# Patient Record
Sex: Female | Born: 1965 | Hispanic: Yes | Marital: Single | Smoking: Never smoker
Health system: Southern US, Community
[De-identification: ages and names within clinical notes are randomized; demographics above are authoritative.]

## PROBLEM LIST (undated history)

## (undated) DIAGNOSIS — K529 Noninfective gastroenteritis and colitis, unspecified: Secondary | ICD-10-CM

## (undated) DIAGNOSIS — R160 Hepatomegaly, not elsewhere classified: Secondary | ICD-10-CM

## (undated) DIAGNOSIS — K219 Gastro-esophageal reflux disease without esophagitis: Secondary | ICD-10-CM

## (undated) DIAGNOSIS — E785 Hyperlipidemia, unspecified: Secondary | ICD-10-CM

## (undated) DIAGNOSIS — R52 Pain, unspecified: Secondary | ICD-10-CM

## (undated) HISTORY — DX: Hyperlipidemia, unspecified: E78.5

## (undated) HISTORY — DX: Hepatomegaly, not elsewhere classified: R16.0

## (undated) HISTORY — PX: TUBAL LIGATION: SHX77

---

## 2007-02-12 HISTORY — PX: NECK MASS EXCISION: SHX2079

## 2012-07-21 ENCOUNTER — Ambulatory Visit: Payer: No Typology Code available for payment source | Attending: Family Medicine | Admitting: Family Medicine

## 2012-07-21 ENCOUNTER — Encounter: Payer: Self-pay | Admitting: Family Medicine

## 2012-07-21 VITALS — BP 126/79 | HR 63 | Temp 98.0°F | Resp 18 | Ht <= 58 in | Wt 153.0 lb

## 2012-07-21 DIAGNOSIS — R1011 Right upper quadrant pain: Secondary | ICD-10-CM | POA: Insufficient documentation

## 2012-07-21 DIAGNOSIS — R5381 Other malaise: Secondary | ICD-10-CM

## 2012-07-21 DIAGNOSIS — D1803 Hemangioma of intra-abdominal structures: Secondary | ICD-10-CM

## 2012-07-21 DIAGNOSIS — R5383 Other fatigue: Secondary | ICD-10-CM | POA: Insufficient documentation

## 2012-07-21 LAB — POCT URINALYSIS DIPSTICK
Blood, UA: NEGATIVE
Glucose, UA: NEGATIVE
Nitrite, UA: NEGATIVE
Protein, UA: NEGATIVE
Spec Grav, UA: 1.02
Urobilinogen, UA: 0.2

## 2012-07-21 LAB — POCT GLYCOSYLATED HEMOGLOBIN (HGB A1C): Hemoglobin A1C: 5.6

## 2012-07-21 LAB — CBC
Platelets: 256 10*3/uL (ref 150–400)
RBC: 5.48 MIL/uL — ABNORMAL HIGH (ref 3.87–5.11)
RDW: 14.1 % (ref 11.5–15.5)
WBC: 6.3 10*3/uL (ref 4.0–10.5)

## 2012-07-21 NOTE — Patient Instructions (Addendum)
Dolor abdominal, versión ampliada °(Abdominal Pain, Nonspecific) °El análisis podría no mostrar la razón exacta por la que tiene dolor abdominal. Debido a que hay muchas causas distintas de dolor abdominal, se podrá necesitar otro control y más análisis. Es muy importante el seguimiento para observar los síntomas duraderos (persistentes) o los que empeoran. Una causa posible de dolor abdominal en cualquier persona que aún tiene su apéndice es la apendicitis aguda. La apendicitis es a menudo difícil de diagnosticar. Los análisis de sangre, orina, ultrasonido y tomografía computada no pueden descartar por completo la apendicitis u otra causas de dolor abdominal. A veces, sólo los cambios que se producen a través del tiempo permitirán determinar si el dolor abdominal se debe al apendicitis o a otras causas. Otros problemas potenciales que pueden requerir cirugía también pueden tomar algún tiempo hasta ser evidentes. Debido a esto, es importante seguir todas las instrucciones de más abajo. °INSTRUCCIONES PARA EL CUIDADO DOMICILIARIO °· Descanse todo lo que pueda. °· No ingiera alimentos sólidos hasta que el dolor desaparezca. °· Cuando un adulto o un niño siente dolor: Puede beneficiarlo una dieta basada en agua, té liviano descafeinado, caldo o consomé, gelatina, solución de rehidratación oral, helados de agua o trocitos de hielo. °· Cuando el adulto o el niño no sienten más dolor: Consuma una dieta liviana (tostadas secas, crackers, jugo de manzana o arroz blanco). Incorpore más alimentos lentamente, siempre que esto no le cause ningún trastorno. No consuma productos lácteos (incluyendo queso y huevos) ni ingiera alimentos condimentados, grasos, fritos o con gran cantidad de fibra. °· No consuma alcohol, cafeína ni cigarrillos. °· Tome sus medicamentos regularmente, excepto que el profesional le indique lo contrario. °· Utilice los medicamentos de venta libre o de prescripción para el dolor, el malestar o la fiebre,  según se lo indique el profesional que lo asiste. °· Utilice los medicamentos de venta libre o de prescripción para el dolor, el malestar o la fiebre, según se lo indique el profesional que lo asiste. No administre aspirina a los niños. °Si el médico le ha dado fecha para una visita de control, es importante que concurra. No cumplir con este control puede dar como resultado que el daño, el dolor o la discapacidad sean permanentes (crónicos). Si tiene problemas para asistir al control, deberá comunicarlo en este establecimiento para recibir asesoramiento.  °SOLICITE ATENCIÓN MÉDICA DE INMEDIATO SI: °· Usted o su niño han sufrido dolor por más de 24 horas. °· El dolor empeora, cambia de lugar o se siente diferente. °· Usted o su niño tienen una temperatura oral de más de 38,9° C (102° F) y no puede ser controlada con medicamentos. °· Su bebé tiene más de 3 meses y su temperatura rectal es de 102° F (38.9° C) o más. °· Su bebé tiene 3 meses o menos y su temperatura rectal es de 100.4° F (38° C) o más. °· Usted o su hijo tienen escalofríos. °· Continúan con vómitos y no pueden retener líquidos. °· Observa sangre en el vómito o en la materia fecal. °· Las heces son oscuras o negras. °· Los movimientos intestinales son frecuentes. °· Los movimientos intestinales se detienen (hay una obstrucción) o no pueden eliminarse los gases. °· Siente dolor al orinar o lo hace con frecuencia u observa sangre en la orina. °· La piel y la zona blanca de los ojos cambian de color y se tornan amarillos. °· Observa que el estómago se hincha o está más grande. °· Sienten mareos o desmayos. °· Sienten dolor en   el pecho o la espalda. °ESTÉ SEGURO QUE:  °· Comprende las instrucciones para el alta médica. °· Controlará su enfermedad. °· Solicitará atención médica de inmediato según las indicaciones. °Document Released: 05/07/2007 Document Revised: 04/22/2011 °ExitCare® Patient Information ©2014 [employer]. ° °

## 2012-07-21 NOTE — Progress Notes (Signed)
Patient states she is here to establish care; she states she was diagnosed with a mass on her liver 1 year ago.

## 2012-07-21 NOTE — Progress Notes (Signed)
Patient ID: Deborah Trujillo, female   DOB: Jun 22, 1965, 47 y.o.   MRN: [id number]  CC: Get established  HPI: Translator use to communicate with patient.  This patient is presenting today as a new patient to the clinic to evaluate a hemangioma that she was diagnosed as having on her liver approximately one year ago in [place].  She had an MRI at that time.  She reports that she was told that she possibly needed surgery to have the tumor removed but it was a risky procedure.  She never followed up.  She has not been seen in over one year.  She has not had labs done in over one year.  She is presenting today reporting that she continues to have occasional pain and discomfort in the right upper quadrant.  No nausea or vomiting.  No weight loss.  No fever chills reported.  No Known Allergies Past Medical History  Diagnosis Date  . Liver mass    No current outpatient prescriptions on file prior to visit.   No current facility-administered medications on file prior to visit.   Family History  Problem Relation Age of Onset  . Diabetes Mother    History   Social History  . Marital Status: Single    Spouse Name: N/A    Number of Children: N/A  . Years of Education: N/A   Occupational History  . Not on file.   Social History Main Topics  . Smoking status: Never Smoker   . Smokeless tobacco: Not on file  . Alcohol Use: No  . Drug Use: No  . Sexually Active: Not on file   Other Topics Concern  . Not on file   Social History Narrative  . No narrative on file    Review of Systems  Constitutional: Negative for fever, chills, diaphoresis, activity change, appetite change and fatigue.  HENT: Negative for ear pain, nosebleeds, congestion, facial swelling, rhinorrhea, neck pain, neck stiffness and ear discharge.   Eyes: Negative for pain, discharge, redness, itching and visual disturbance.  Respiratory: Negative for cough, choking, chest tightness, shortness of breath, wheezing and  stridor.   Cardiovascular: Negative for chest pain, palpitations and leg swelling.  Gastrointestinal: abdominal discomfort RUQ. Genitourinary: Negative for dysuria, urgency, frequency, hematuria, flank pain, decreased urine volume, difficulty urinating and dyspareunia.  Musculoskeletal: Negative for back pain, joint swelling, arthralgias and gait problem.  Neurological: Negative for dizziness, tremors, seizures, syncope, facial asymmetry, speech difficulty, weakness, light-headedness, numbness and headaches.  Hematological: Negative for adenopathy. Does not bruise/bleed easily.  Psychiatric/Behavioral: Negative for hallucinations, behavioral problems, confusion, dysphoric mood, decreased concentration and agitation.    Objective:   Filed Vitals:   07/21/12 1318  BP: 126/79  Pulse: 63  Temp: 98 F (36.7 C)  Resp: 18    Physical Exam  Constitutional: Appears well-developed and well-nourished. No distress.  HENT: Normocephalic. External right and left ear normal. Oropharynx is clear and moist.  Eyes: Conjunctivae and EOM are normal. PERRLA, no scleral icterus.  Neck: Normal ROM. Neck supple. No JVD. No tracheal deviation. No thyromegaly.  CVS: RRR, S1/S2 +, no murmurs, no gallops, no carotid bruit.  Pulmonary: Effort and breath sounds normal, no stridor, rhonchi, wheezes, rales.  Abdominal: Soft. BS +,  no distension, mild hepatomegaly, mild tenderness, no rebound or guarding.  Musculoskeletal: Normal range of motion. No edema and no tenderness.  Lymphadenopathy: No lymphadenopathy noted, cervical, inguinal. Neuro: Alert. Normal reflexes, muscle tone coordination. No cranial nerve deficit. Skin: Skin is warm and  dry. No rash noted. Not diaphoretic. No erythema. No pallor.  Psychiatric: Normal mood and affect. Behavior, judgment, thought content normal.   No results found for this basename: WBC, HGB, HCT, MCV, PLT   No results found for this basename: CREATININE, BUN, NA, K, CL, CO2     No results found for this basename: HGBA1C   Lipid Panel  No results found for this basename: chol, trig, hdl, cholhdl, vldl, ldlcalc       Assessment and plan:   Hepatic Hemangioma Fatigue Question of prediabetes  Check labs today to evaluate liver enzymes Spent a long time trying to determine the translation of the forms that she brought detailing the MRI that she had done in [place] confirming that she had a hemangioma in her liver.  I discussed that with the patient today.  I want to send her to gastroenterology and [facility] for further evaluation and treatment.  The patient likely will need some intervention if her symptoms worsen.  I told her to go to the hospital if she develops severe pain in her abdomen or any other acute findings that she verbalized understanding.  This was all done in conjunction with the translator.  Followup in 4 months  The patient was given clear instructions to go to ER or return to medical center if symptoms don't improve, worsen or new problems develop.  The patient verbalized understanding.  The patient was told to call to get lab results if they haven't heard anything in the next week.    Rodney Langton, MD, CDE, FAAFP [facility] [place], [place]

## 2012-07-22 ENCOUNTER — Other Ambulatory Visit: Payer: Self-pay | Admitting: Gastroenterology

## 2012-07-22 DIAGNOSIS — R1011 Right upper quadrant pain: Secondary | ICD-10-CM

## 2012-07-22 LAB — LIPID PANEL
Cholesterol: 219 mg/dL — ABNORMAL HIGH (ref 0–200)
Total CHOL/HDL Ratio: 4.8 Ratio

## 2012-07-22 LAB — COMPREHENSIVE METABOLIC PANEL
ALT: 18 U/L (ref 0–35)
AST: 16 U/L (ref 0–37)
CO2: 27 mEq/L (ref 19–32)
Calcium: 9.5 mg/dL (ref 8.4–10.5)
Chloride: 105 mEq/L (ref 96–112)
Creat: 0.88 mg/dL (ref 0.50–1.10)
Sodium: 143 mEq/L (ref 135–145)
Total Protein: 7.3 g/dL (ref 6.0–8.3)

## 2012-07-22 LAB — TSH: TSH: 1.557 u[IU]/mL (ref 0.350–4.500)

## 2012-07-22 LAB — VITAMIN D 25 HYDROXY (VIT D DEFICIENCY, FRACTURES): Vit D, 25-Hydroxy: 28 ng/mL — ABNORMAL LOW (ref 30–89)

## 2012-07-22 NOTE — Progress Notes (Signed)
Quick Note:  Please inform patient that her labs came back okay. Her liver enzymes were within normal limits. Her cholesterol levels were elevated. I recommend that she start a low-fat low-cholesterol diet and start walking 3-5 times per week. We should recheck her cholesterol levels again in about 3-4 months.  Rodney Langton, MD, CDE, FAAFP [facility] [place], [place]   ______

## 2012-07-23 ENCOUNTER — Telehealth: Payer: Self-pay | Admitting: *Deleted

## 2012-07-23 NOTE — Telephone Encounter (Signed)
07/23/12 Patient made aware of lab results spoke with sister in law pt. Does not speak English  Hepatic Function Panel     Component Value Date/Time   PROT 7.3 07/21/2012 1357   ALBUMIN 4.0 07/21/2012 1357   AST 16 07/21/2012 1357   ALT 18 07/21/2012 1357   ALKPHOS 40 07/21/2012 1357   BILITOT 0.4 07/21/2012 1357   Sister in law made aware liver enzymes were within normal limits and her cholesrterol was elevated  Patient instructed to eat low fat low cholesterol foods and start walking 3-5 times per week. Will recheck again in 3-4 months. Verbalize and understands. Will call and schedule appointment. P.Naomee Nowland,RN BSN [facility]

## 2012-07-30 ENCOUNTER — Ambulatory Visit
Admission: RE | Admit: 2012-07-30 | Discharge: 2012-07-30 | Disposition: A | Discharge status: Home / Self Care | Payer: No Typology Code available for payment source | Source: Ambulatory Visit | Attending: Gastroenterology | Admitting: Gastroenterology

## 2012-07-30 DIAGNOSIS — R1011 Right upper quadrant pain: Secondary | ICD-10-CM

## 2012-07-30 MED ORDER — GADOXETATE DISODIUM 0.25 MMOL/ML IV SOLN
6.0000 mL | Freq: Once | INTRAVENOUS | Status: AC | PRN
  Administered 2012-07-30: 6 mL via INTRAVENOUS

## 2012-08-05 ENCOUNTER — Encounter: Payer: Self-pay | Admitting: *Deleted

## 2012-08-20 ENCOUNTER — Telehealth (INDEPENDENT_AMBULATORY_CARE_PROVIDER_SITE_OTHER): Payer: Self-pay

## 2012-08-20 NOTE — Telephone Encounter (Signed)
LM with pt's sister-in-law to request that the patient bring CD of past imaging to her appointment with Dr. Donell Beers.

## 2012-08-21 ENCOUNTER — Encounter (INDEPENDENT_AMBULATORY_CARE_PROVIDER_SITE_OTHER): Payer: Self-pay | Admitting: General Surgery

## 2012-08-21 ENCOUNTER — Ambulatory Visit (INDEPENDENT_AMBULATORY_CARE_PROVIDER_SITE_OTHER): Payer: PRIVATE HEALTH INSURANCE | Admitting: General Surgery

## 2012-08-21 VITALS — BP 130/74 | HR 66 | Temp 97.6°F | Resp 14 | Ht 60.0 in | Wt 153.4 lb

## 2012-08-21 DIAGNOSIS — D1803 Hemangioma of intra-abdominal structures: Secondary | ICD-10-CM

## 2012-08-21 NOTE — Progress Notes (Signed)
Chief Complaint  Patient presents with  . New Evaluation    eval abd pain/liver mass    HISTORY: Patient is a 14 show female who presents with a hepatic hemangioma. She is referred from Dr. Dulce Sellar.  She was diagnosed with this around a year ago after having an ultrasound and an MRI in [place].  When she was first seen in [place], her symptoms included some bloating and some epigastric burning. She was placed on some medication and change her diet and this helps some of the discomfort. She continues to have severe burning along her right flank and lateral abdomen going down her side. She also describes a feeling of warmth in this area after she has increased activity.  She denies nausea and vomiting. She denies changes in her bowel habits. She denies fevers, chills, jaundice. She denies any change in her symptoms after eating. She denies reflux and heartburn at this time. She is currently not taking any medications. 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  She does not do any heavy lifting. Her job is mainly sedentary.  She states that she is never without pain. The pain does wax and wane. It is usually around  A 2 or 3 but goes to an 8 or 9 with any significant activity and at the end of the day if she's been doing a lot of housework.  Past Medical History  Diagnosis Date  . Liver mass   . Hyperlipidemia     Past Surgical History  Procedure Laterality Date  . Neck mass excision  2009    absess removed   . Abdominal hysterectomy  1987    No current outpatient prescriptions on file.   No current facility-administered medications for this visit.     No Known Allergies   Family History  Problem Relation Age of Onset  . Diabetes Mother      History   Social History  . Marital Status: Single    Spouse Name: N/A    Number of Children: N/A  . Years of Education: N/A   Social History Main Topics  . Smoking status: Never Smoker   . Smokeless tobacco: None   . Alcohol Use: No  . Drug Use: No  . Sexually Active: None   Other Topics Concern  . None   Social History Narrative  . None     REVIEW OF SYSTEMS - PERTINENT POSITIVES ONLY: 12 point review of systems negative other than HPI and PMH except for easy bruising.  EXAM: Filed Vitals:   08/21/12 0902  BP: 130/74  Pulse: 66  Temp: 97.6 F (36.4 C)  Resp: 14   Body mass index is 29.96 kg/(m^2). Filed Weights   08/21/12 0902  Weight: 153 lb 6.4 oz (69.582 kg)     Gen:  No acute distress.  Well nourished and well groomed.   Neurological: Alert and oriented to person, place, and time. Coordination normal.  Head: Normocephalic and atraumatic.  Eyes: Conjunctivae are normal. Pupils are equal, round, and reactive to light. No scleral icterus.  Neck: Normal range of motion. Neck supple. No tracheal deviation or thyromegaly present.  Cardiovascular: Normal rate, regular rhythm, normal heart sounds and intact distal pulses.  Exam reveals no gallop and no friction rub.  No murmur heard. Respiratory: Effort normal.  No respiratory distress. No chest wall tenderness. Breath sounds normal.  No wheezes, rales or rhonchi.  GI: Soft. Bowel sounds are normal. The abdomen is soft  and nontender.  There is no rebound and no guarding.  Musculoskeletal: Normal range of motion. Extremities are nontender.  Lymphadenopathy: No cervical, preauricular, postauricular or axillary adenopathy is present Skin: Skin is warm and dry. No rash noted. No diaphoresis. No erythema. No pallor. No clubbing, cyanosis, or edema.   Psychiatric: Normal mood and affect. Behavior is normal. Judgment and thought content normal.    LABORATORY RESULTS: Available labs are reviewed  CMET normal, HCT 47, elevated lipids.    RADIOLOGY RESULTS: See E-Chart or I-Site for most recent results.  Images and reports are reviewed.  MRI IMPRESSION:  1. Benign hepatic hemangiomas, largest measuring 6.4 cm in the  posterior  segment of the right hepatic lobe.  2. Two tiny less than 5 mm cystic lesions of the pancreas body and  tail, which are almost certainly benign. Followup by MRI recommended  in 12 months to confirm stability. This recommendation follows ACR  consensus guidelines: Managing Incidental Findings on Abdominal CT:  White Paper of the ACR Incidental Findings Committee. J Am Coll  Radiol 2010;7:754-773 ADDENDUM:  Prior outside CT from [facility] dated 06/05/2011 has been  submitted for comparison. The large hemangioma in the right hepatic  lobe shows no significant change compared to this prior study. The  other smaller hemangiomas were not well visualized on previous  study, likely due to decreased contrast resolution of CT compared to  MRI. The tiny cystic lesions of the pancreas are not well visualized  on previous CT, also likely due to decreased contrast resolution of  CT compared to MRI. As stated below, followup by MRI is recommended  in 12 months to confirm stability of these tiny pancreatic lesions.   ASSESSMENT AND PLAN: Hepatic hemangioma Pt to think over surgery and risks.   She will discuss with her family.  She will call when she is ready to schedule or if she needs another appointment to discuss surgery before making up her mind.    I reviewed surgery with diagrams of anatomy.  I also reviewed her MRI images with her and her family.    The patient is considering partial hepatectomy.  The procedure was described to the patient including the incision, surgical technique, goals of surgery, post op period and recovery time.  We discussed lifting and driving restrictions as well as time off work.  The complications were described to the patient.  The complications below were discussed, as well as the fact that unpredicted complications could occur.     Bleeding, possibly requiring a blood transfusion. Infection and possible wound complications such as hernia Possible  abortion of the operation due to cirrhosis. Damage to adjacent structures Leak of bile from the surface of the liver Possible need for other procedures, such as abscess drains in radiology or endoscopy.   Possible prolonged hospital stay Possible need for ICU stay including time on the ventilator. Decreased energy level.    Possible early recurrence of cancer Possible complications of your medical problems such as heart disease or arrhythmias. Death (less than 2%)  45 min spent in evaluation, examination, and counseling, with >50% in counseling.      She was advised to consider if the level of pain she is experiencing is enough to warrant risks of procedure and time off work.     Maudry Diego MD Surgical Oncology, General and Endocrine Surgery [facility]      Visit Diagnoses: 1. Hepatic hemangioma     Primary Care Physician:  Standley Dakins, MD

## 2012-08-21 NOTE — Patient Instructions (Signed)
Whatever the operative findings, I will discuss the case with your family after we are done in the operating room.  I will talk to you in the next few days when you are more awake.  I will see you in the hospital every weekday that I am not out of town.  My partners help see patients on the weekends and if I am out of town.    WHAT HAPPENS AFTER SURGERY: After surgery, you will go first to the recovery room, then to the ICU for careful monitoring.  It is important that I am able to know your vital signs and urine output to see how you are doing.  Depending on many factors, you may be in the ICU overnight, or for several days.  You will have many tubes and lines in place which is standard for this operation.  You will have several IVs, including possibly a central line in your chest or neck.  You will likely have an arterial line in your wrist.  You will have a tube in your nose for 4-5 days in order to suction out the stomach and liver secretions to help the surgical connections heal.  YOU WILL NOT BE ABLE TO EAT FOR AT LEAST 1-3 DAYS AFTER SURGERY.  You will have a catheter in your bladder.  On your abdomen, you will have several surgical drains and possibly a pain pump with numbing medicine.  You will have compression stockings on your legs to decrease the risk of blood clots.    Sometimes anesthesia makes a decision that you may need to be left on the breathing machine for a period after surgery.  This may be based on your overall health status, or events during surgery.    We will address your pain in several ways.  We will use an IV pain pump called a "PCA," or Patient Controlled Analgesia.  This allows you to press a button and immediately receive a dose of pain medication without waiting for a nurse.  We also use IV Tylenol and sometimes IV Toradol which is similar to ibuprofen.  You may also have a pump with numbing medicine delivered directly to your incision.  I use doses and medications that work  for the majority of people, but you may need an adjustment to the dose or type of medicine if your pain is not adequately controlled.  Your throat may be sore, in which case you may need a throat spray or lozenges.    We will ask you to get out of bed the day after surgery in order to maximize your chances of not having complications.  Your risk of pneumonia and blood clots is lower with walking and sitting in the chair.  We will also ask you to perform breathing exercises.  We will also ask to you walk in your room and in the halls for the above reasons, but also in order for you to keep up your strength.    EATING: We will usually start you on clear liquids in around 1-2 days if your bowel function seems to have returned.  We advance your diet slowly to make sure you are tolerating each step.  All patients do not have a normal appetite when they go home and usually have to take 2-4 cans of nutritional supplement per day while this is improving.  Most patients also find that their taste buds do not seem the same right after surgery, and this can continue into the time  of possible post operative chemotherapy and radiation.  Some patients develop diabetes and will need assistance from a primary care doctor for medication.     GOING HOME! Usually you are able to go home in 5-7 days, depending on whether or not complications happen and what is going on with your overall health status.   If you have more health problems or if you have limited help at home, the therapists and nurses may recommend a temporary rehab or nursing facility to help you get back on your feet before you go home.  These decisions would be made while you are in the hospital with the assistance of a social worker or case manager.    Please bring all insurance/disability forms to our office for the staff to fill out   POSSIBLE COMPLICATIONS This is a very extensive operation and includes complications listed  below: Bleeding Infection and possible wound complications such as hernia Damage to adjacent structures Leak of bile from the surface of the liver Possible need for other procedures, such as abscess drains in radiology or endoscopy.   Possible prolonged hospital stay MOST PATIENTS' ENERGY LEVEL IS NOT BACK TO NORMAL FOR AT LEAST 4-6 MONTHS.  OLDER PATIENTS MAY FEEL WEAK FOR LONGER PERIODS OF TIME.   Difficulty with eating or post operative nausea (around 30%) Possible early recurrence of cancer Possible complications of your medical problems such as heart disease or arrhythmias. Death (less than 2%)  All possible complications are not listed, just the most common.    FURTHER INFORMATION? Please ask questions if you find something that we did not discuss in the office and would like more information.  If you would like another appointment if you have many questions or if your family members would like to come as well, please contact the office.     IF YOU ARE TAKING ASPIRIN, PLAVIX, COUMADIN, OR OTHER BLOOD THINNERS, LET us KNOW IMMEDIATELY SO WE CAN CONTACT YOUR PRESCRIBING HEALTH CARE PROVIDER TO HOLD THE MEDICATION FOR 5-7 DAYS BEFORE SURGERY

## 2012-08-21 NOTE — Assessment & Plan Note (Signed)
Pt to think over surgery and risks.   She will discuss with her family.  She will call when she is ready to schedule or if she needs another appointment to discuss surgery before making up her mind.    I reviewed surgery with diagrams of anatomy.  I also reviewed her MRI images with her and her family.    The patient is considering partial hepatectomy.  The procedure was described to the patient including the incision, surgical technique, goals of surgery, post op period and recovery time.  We discussed lifting and driving restrictions as well as time off work.  The complications were described to the patient.  The complications below were discussed, as well as the fact that unpredicted complications could occur.     Bleeding, possibly requiring a blood transfusion. Infection and possible wound complications such as hernia Possible abortion of the operation due to cirrhosis. Damage to adjacent structures Leak of bile from the surface of the liver Possible need for other procedures, such as abscess drains in radiology or endoscopy.   Possible prolonged hospital stay Possible need for ICU stay including time on the ventilator. Decreased energy level.    Possible early recurrence of cancer Possible complications of your medical problems such as heart disease or arrhythmias. Death (less than 2%)  45 min spent in evaluation, examination, and counseling, with >50% in counseling.

## 2013-07-09 ENCOUNTER — Telehealth (INDEPENDENT_AMBULATORY_CARE_PROVIDER_SITE_OTHER): Payer: Self-pay

## 2013-07-09 NOTE — Telephone Encounter (Signed)
LMOV pt's appt moved to 07/26/13 at 10:30 a.m.

## 2013-07-19 ENCOUNTER — Ambulatory Visit: Payer: Self-pay

## 2013-07-26 ENCOUNTER — Encounter (INDEPENDENT_AMBULATORY_CARE_PROVIDER_SITE_OTHER): Payer: Self-pay | Admitting: General Surgery

## 2013-07-26 ENCOUNTER — Ambulatory Visit (INDEPENDENT_AMBULATORY_CARE_PROVIDER_SITE_OTHER): Payer: PRIVATE HEALTH INSURANCE | Admitting: General Surgery

## 2013-07-26 VITALS — BP 118/80 | HR 72 | Temp 98.7°F | Resp 16 | Ht <= 58 in | Wt 156.6 lb

## 2013-07-26 DIAGNOSIS — K862 Cyst of pancreas: Secondary | ICD-10-CM | POA: Insufficient documentation

## 2013-07-26 DIAGNOSIS — D1803 Hemangioma of intra-abdominal structures: Secondary | ICD-10-CM

## 2013-07-26 DIAGNOSIS — K863 Pseudocyst of pancreas: Secondary | ICD-10-CM

## 2013-07-26 NOTE — Assessment & Plan Note (Signed)
Follow up MRI/MRCP to evaluate.

## 2013-07-26 NOTE — Assessment & Plan Note (Signed)
Pt does have pain exactly at location of hemangioma without musculoskeletal component.    She has not had imaging in a year.  Will repeat MRI.    Follow up after MRI.

## 2013-07-26 NOTE — Patient Instructions (Signed)
Get MRI, follow up in around 2 weeks (after scan)

## 2013-07-26 NOTE — Progress Notes (Signed)
HISTORY: Pt is here for increasing pain in R flank/RUQ near hemangioma and would like to pursue resection.  She describes pain as 6-7/10.  She is not taking pain medication.  She denies heart failure symptoms or n/v.  She denies change in pain with activity other than reaching over her head.  She also has increased pain when she sleeps on that side.  She is not having fever/ chills.     PERTINENT REVIEW OF SYSTEMS: O/w negative x 11.    Filed Vitals:   07/26/13 1051  BP: 118/80  Pulse: 72  Temp: 98.7 F (37.1 C)  Resp: 16   Wt Readings from Last 3 Encounters:  07/26/13 156 lb 9.6 oz (71.033 kg)  08/21/12 153 lb 6.4 oz (69.582 kg)  07/21/12 153 lb (69.4 kg)    EXAM: Head: Normocephalic and atraumatic.  Eyes:  Conjunctivae are normal. Pupils are equal, round, and reactive to light. No scleral icterus.  CV:  Reg rate.   Resp: No respiratory distress, normal effort. Abd:  Abdomen is soft, non distended and non tender. No masses are palpable.  There is no rebound and no guarding. No replicable pain on exam of flank or RUQ.   Neurological: Alert and oriented to person, place, and time. Coordination normal.  Skin: Skin is warm and dry. No rash noted. No diaphoretic. No erythema. No pallor.  Psychiatric: Normal mood and affect. Normal behavior. Judgment and thought content normal.   MRI 07/2012  IMPRESSION:  1. Benign hepatic hemangiomas, largest measuring 6.4 cm in the  posterior segment of the right hepatic lobe.  2. Two tiny less than 5 mm cystic lesions of the pancreas body and  tail, which are almost certainly benign. Followup by MRI recommended  in 12 months to confirm stability. This recommendation follows ACR  consensus guidelines: Managing Incidental Findings on Abdominal CT:  White Paper of the ACR Incidental Findings Committee. J Am Coll  Radiol 2010;7:754-773   ASSESSMENT AND PLAN:   Hepatic hemangioma Pt does have pain exactly at location of hemangioma without  musculoskeletal component.    She has not had imaging in a year.  Will repeat MRI.    Follow up after MRI.    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      Milus Height, MD Surgical Oncology, [facility]  Irwin Brakeman, MD Murlean Iba, MD

## 2013-08-03 ENCOUNTER — Other Ambulatory Visit (INDEPENDENT_AMBULATORY_CARE_PROVIDER_SITE_OTHER): Payer: Self-pay | Admitting: General Surgery

## 2013-08-03 ENCOUNTER — Ambulatory Visit (HOSPITAL_COMMUNITY)
Admission: RE | Admit: 2013-08-03 | Discharge: 2013-08-03 | Disposition: A | Discharge status: Home / Self Care | Payer: No Typology Code available for payment source | Source: Ambulatory Visit | Attending: General Surgery | Admitting: General Surgery

## 2013-08-03 DIAGNOSIS — K863 Pseudocyst of pancreas: Principal | ICD-10-CM

## 2013-08-03 DIAGNOSIS — D1803 Hemangioma of intra-abdominal structures: Secondary | ICD-10-CM

## 2013-08-03 DIAGNOSIS — K862 Cyst of pancreas: Secondary | ICD-10-CM | POA: Insufficient documentation

## 2013-08-03 MED ORDER — GADOBENATE DIMEGLUMINE 529 MG/ML IV SOLN
14.0000 mL | Freq: Once | INTRAVENOUS | Status: AC | PRN
  Administered 2013-08-03: 14 mL via INTRAVENOUS

## 2013-08-09 ENCOUNTER — Ambulatory Visit (INDEPENDENT_AMBULATORY_CARE_PROVIDER_SITE_OTHER): Payer: Self-pay | Admitting: General Surgery

## 2013-08-09 ENCOUNTER — Telehealth (INDEPENDENT_AMBULATORY_CARE_PROVIDER_SITE_OTHER): Payer: Self-pay

## 2013-08-09 NOTE — Telephone Encounter (Signed)
[facility] pt has appt with Dr. Barry Dienes on 08/16/13 to discuss surgery.

## 2013-08-16 ENCOUNTER — Encounter (INDEPENDENT_AMBULATORY_CARE_PROVIDER_SITE_OTHER): Payer: Self-pay | Admitting: General Surgery

## 2013-08-16 ENCOUNTER — Ambulatory Visit (INDEPENDENT_AMBULATORY_CARE_PROVIDER_SITE_OTHER): Payer: PRIVATE HEALTH INSURANCE | Admitting: General Surgery

## 2013-08-16 VITALS — BP 112/82 | HR 84 | Resp 14 | Ht <= 58 in | Wt 154.4 lb

## 2013-08-16 DIAGNOSIS — D1803 Hemangioma of intra-abdominal structures: Secondary | ICD-10-CM

## 2013-08-16 NOTE — Assessment & Plan Note (Signed)
Pt continues to have pain in the right posterior abdomen/flank.   We will plan partial right hepatectomy. The patient elects to undergo partial hepatectomy.  The procedure was described to the patient including the incision, surgical technique, goals of surgery, post op period and recovery time.  We discussed lifting and driving restrictions as well as time off work.  The complications were described to the patient.  The complications below were discussed, as well as the fact that unpredicted complications could occur.     Bleeding, possibly requiring a blood transfusion. Infection and possible wound complications such as hernia Possible abortion of the operation due to cirrhosis. Damage to adjacent structures Leak of bile from the surface of the liver Possible need for other procedures, such as abscess drains in radiology or endoscopy.   Possible prolonged hospital stay Possible need for ICU stay including time on the ventilator. Decreased energy level.    Possible early recurrence of cancer Possible complications of your medical problems such as heart disease or arrhythmias. Death (less than 2%)

## 2013-08-16 NOTE — Progress Notes (Signed)
HISTORY: Pt is here for increasing pain in R flank/RUQ near hemangioma and would like to pursue resection. Pt continuing to have pain.  She brings in her family to review her MRI and to discuss surgery.    PERTINENT REVIEW OF SYSTEMS: O/w negative x 11.    Filed Vitals:   08/16/13 1422  BP: 112/82  Pulse: 84  Resp: 14   Wt Readings from Last 3 Encounters:  08/16/13 154 lb 6.4 oz (70.035 kg)  07/26/13 156 lb 9.6 oz (71.033 kg)  08/21/12 153 lb 6.4 oz (69.582 kg)    EXAM: Head: Normocephalic and atraumatic.  Eyes:  Conjunctivae are normal. Pupils are equal, round, and reactive to light. No scleral icterus.  CV:  Reg rate.   Resp: No respiratory distress, normal effort. Abd:  Abdomen is soft, non distended and non tender. No masses are palpable.  There is no rebound and no guarding.  Neurological: Alert and oriented to person, place, and time. Coordination normal.  Skin: Skin is warm and dry. No rash noted. No diaphoretic. No erythema. No pallor.  Psychiatric: Normal mood and affect. Normal behavior. Judgment and thought content normal.   MRI 07/2012  IMPRESSION:  1. Benign hepatic hemangiomas, largest measuring 6.4 cm in the  posterior segment of the right hepatic lobe.  2. Two tiny less than 5 mm cystic lesions of the pancreas body and  tail, which are almost certainly benign. Followup by MRI recommended  in 12 months to confirm stability. This recommendation follows ACR  consensus guidelines: Managing Incidental Findings on Abdominal CT:  White Paper of the ACR Incidental Findings Committee. J Am Coll  Radiol 2010;7:754-773   ASSESSMENT AND PLAN:   Hepatic hemangioma Pt continues to have pain in the right posterior abdomen/flank.   We will plan partial right hepatectomy. The patient elects to undergo partial hepatectomy.  The procedure was described to the patient including the incision, surgical technique, goals of surgery, post op period and recovery time.  We discussed  lifting and driving restrictions as well as time off work.  The complications were described to the patient.  The complications below were discussed, as well as the fact that unpredicted complications could occur.     Bleeding, possibly requiring a blood transfusion. Infection and possible wound complications such as hernia Possible abortion of the operation due to cirrhosis. Damage to adjacent structures Leak of bile from the surface of the liver Possible need for other procedures, such as abscess drains in radiology or endoscopy.   Possible prolonged hospital stay Possible need for ICU stay including time on the ventilator. Decreased energy level.    Possible early recurrence of cancer Possible complications of your medical problems such as heart disease or arrhythmias. Death (less than 2%)       Milus Height, MD Surgical Oncology, [facility]  Angelica Chessman, MD Angelica Chessman, MD

## 2013-08-16 NOTE — Patient Instructions (Signed)
Whatever the operative findings, I will discuss the case with your family after we are done in the operating room.  I will talk to you in the next few days when you are more awake.  I will see you in the hospital every weekday that I am not out of town.  My partners help see patients on the weekends and if I am out of town.    WHAT HAPPENS AFTER SURGERY: After surgery, you will go first to the recovery room, then to the ICU for careful monitoring.  It is important that I am able to know your vital signs and urine output to see how you are doing.  Depending on many factors, you may be in the ICU overnight, or for several days.  You will have many tubes and lines in place which is standard for this operation.  You will have several IVs, including possibly a central line in your chest or neck.  You will likely have an arterial line in your wrist.  You will have a tube in your nose for 1-2 days in order to suction out the stomach and liver secretions to help the surgical connections heal.  YOU WILL NOT BE ABLE TO EAT FOR AT LEAST 1-2 DAYS AFTER SURGERY.  You will have a catheter in your bladder.  On your abdomen, you will have several surgical drains and possibly a pain pump with numbing medicine.  You will have compression stockings on your legs to decrease the risk of blood clots.    Sometimes anesthesia makes a decision that you may need to be left on the breathing machine for a period after surgery.  This may be based on your overall health status, or events during surgery.    We will address your pain in several ways.  We will use an IV pain pump called a "PCA," or Patient Controlled Analgesia.  This allows you to press a button and immediately receive a dose of pain medication without waiting for a nurse.  We also use IV Tylenol and sometimes IV Toradol which is similar to ibuprofen.  You may also have a pump with numbing medicine delivered directly to your incision.  I use doses and medications that work  for the majority of people, but you may need an adjustment to the dose or type of medicine if your pain is not adequately controlled.  Your throat may be sore, in which case you may need a throat spray or lozenges.    We will ask you to get out of bed the day after surgery in order to maximize your chances of not having complications.  Your risk of pneumonia and blood clots is lower with walking and sitting in the chair.  We will also ask you to perform breathing exercises.  We will also ask to you walk in your room and in the halls for the above reasons, but also in order for you to keep up your strength.    EATING: We will usually start you on clear liquids in around 2 days if your bowel function seems to have returned.  We advance your diet slowly to make sure you are tolerating each step.  All patients do not have a normal appetite when they go home and usually have to take 2-4 cans of nutritional supplement per day while this is improving.    GOING HOME! Usually you are able to go home in 4-7 days, depending on whether or not complications happen and what  is going on with your overall health status.   If you have more health problems or if you have limited help at home, the therapists and nurses may recommend a temporary rehab or nursing facility to help you get back on your feet before you go home.  These decisions would be made while you are in the hospital with the assistance of a social worker or case manager.    Please bring all insurance/disability forms to our office for the staff to fill out   POSSIBLE COMPLICATIONS This is a very extensive operation and includes complications listed below: Bleeding Infection and possible wound complications such as hernia Damage to adjacent structures Leak of bile from the surface of the liver Possible need for other procedures, such as abscess drains in radiology or endoscopy.   Possible prolonged hospital stay MOST PATIENTS' ENERGY LEVEL IS NOT  BACK TO NORMAL FOR AT LEAST 2-3 MONTHS.  OLDER PATIENTS MAY FEEL WEAK FOR LONGER PERIODS OF TIME.   Difficulty with eating or post operative nausea (around 30%) Possible early recurrence of cancer Possible complications of your medical problems such as heart disease or arrhythmias. Death (less than 2%)  All possible complications are not listed, just the most common.    FURTHER INFORMATION? Please ask questions if you find something that we did not discuss in the office and would like more information.  If you would like another appointment if you have many questions or if your family members would like to come as well, please contact the office.

## 2013-08-16 NOTE — Addendum Note (Signed)
Addended by: Stark Klein on: 08/16/2013 02:45 PM   Modules accepted: Orders

## 2013-09-22 NOTE — Progress Notes (Signed)
[place] with Threasa Beards about interpreter arrangement for Monday, September 27, 2013 at 10 am to meet in Production designer, theatre/television/film at [facility] for pre op visit.   Aware that the appointment is needed working to provide an interpreter.  Will send the request sheet back completed.

## 2013-09-27 ENCOUNTER — Encounter (INDEPENDENT_AMBULATORY_CARE_PROVIDER_SITE_OTHER): Payer: Self-pay

## 2013-09-27 ENCOUNTER — Encounter (HOSPITAL_COMMUNITY): Payer: Self-pay

## 2013-09-27 ENCOUNTER — Encounter (HOSPITAL_COMMUNITY)
Admission: RE | Admit: 2013-09-27 | Discharge: 2013-09-27 | Disposition: A | Discharge status: Home / Self Care | Payer: Self-pay | Source: Ambulatory Visit | Attending: General Surgery | Admitting: General Surgery

## 2013-09-27 ENCOUNTER — Ambulatory Visit (HOSPITAL_COMMUNITY)
Admission: RE | Admit: 2013-09-27 | Discharge: 2013-09-27 | Disposition: A | Discharge status: Home / Self Care | Payer: No Typology Code available for payment source | Source: Ambulatory Visit | Attending: Anesthesiology | Admitting: Anesthesiology

## 2013-09-27 ENCOUNTER — Encounter (HOSPITAL_COMMUNITY): Payer: Self-pay | Admitting: Pharmacy Technician

## 2013-09-27 DIAGNOSIS — Z01818 Encounter for other preprocedural examination: Secondary | ICD-10-CM | POA: Insufficient documentation

## 2013-09-27 HISTORY — DX: Pain, unspecified: R52

## 2013-09-27 HISTORY — DX: Noninfective gastroenteritis and colitis, unspecified: K52.9

## 2013-09-27 LAB — COMPREHENSIVE METABOLIC PANEL
ALBUMIN: 3.9 g/dL (ref 3.5–5.2)
ALK PHOS: 43 U/L (ref 39–117)
ALT: 16 U/L (ref 0–35)
AST: 16 U/L (ref 0–37)
Anion gap: 11 (ref 5–15)
BILIRUBIN TOTAL: 0.4 mg/dL (ref 0.3–1.2)
BUN: 18 mg/dL (ref 6–23)
CHLORIDE: 106 meq/L (ref 96–112)
CO2: 25 meq/L (ref 19–32)
Calcium: 9.6 mg/dL (ref 8.4–10.5)
Creatinine, Ser: 0.77 mg/dL (ref 0.50–1.10)
GFR calc Af Amer: >90 mL/min (ref >90)
GFR calc non Af Amer: >90 mL/min (ref >90)
Glucose, Bld: 95 mg/dL (ref 70–99)
POTASSIUM: 4.2 meq/L (ref 3.7–5.3)
Sodium: 142 mEq/L (ref 137–147)
Total Protein: 7.7 g/dL (ref 6.0–8.3)

## 2013-09-27 LAB — PROTIME-INR
INR: 0.99 (ref 0.00–1.49)
Prothrombin Time: 13.1 seconds (ref 11.6–15.2)

## 2013-09-27 LAB — CBC WITH DIFFERENTIAL/PLATELET
BASOS ABS: 0 10*3/uL (ref 0.0–0.1)
BASOS PCT: 1 % (ref 0–1)
Eosinophils Absolute: 0.1 10*3/uL (ref 0.0–0.7)
Eosinophils Relative: 2 % (ref 0–5)
HCT: 43.7 % (ref 36.0–46.0)
HEMOGLOBIN: 15 g/dL (ref 12.0–15.0)
LYMPHS PCT: 40 % (ref 12–46)
Lymphs Abs: 2.4 10*3/uL (ref 0.7–4.0)
MCH: 29.9 pg (ref 26.0–34.0)
MCHC: 34.3 g/dL (ref 30.0–36.0)
MCV: 87.1 fL (ref 78.0–100.0)
Monocytes Absolute: 0.2 10*3/uL (ref 0.1–1.0)
Monocytes Relative: 4 % (ref 3–12)
NEUTROS ABS: 3.2 10*3/uL (ref 1.7–7.7)
NEUTROS PCT: 53 % (ref 43–77)
Platelets: 172 10*3/uL (ref 150–400)
RBC: 5.02 MIL/uL (ref 3.87–5.11)
RDW: 13.3 % (ref 11.5–15.5)
WBC: 6 10*3/uL (ref 4.0–10.5)

## 2013-09-27 LAB — ABO/RH: ABO/RH(D): A POS

## 2013-09-27 LAB — HCG, SERUM, QUALITATIVE: Preg, Serum: NEGATIVE

## 2013-09-27 LAB — PREPARE RBC (CROSSMATCH)

## 2013-09-27 NOTE — Pre-Procedure Instructions (Signed)
EKG AND CXR WERE DONE TODAY PREOP - PT IS SCHEDULED FOR 5 HOUR SURGERY FOR PARTIAL HEPATECTOMY.

## 2013-09-27 NOTE — Patient Instructions (Addendum)
YOUR SURGERY IS SCHEDULED AT [facility]  ON:   Thursday  AUGUST 20TH  REPORT TO  SHORT STAY CENTER AT:  5:30 AM   PLEASE COME IN THE [facility] MAIN HOSPITAL ENTRANCE AND FOLLOW SIGNS TO SHORT STAY CENTER.  DO NOT EAT OR DRINK ANYTHING AFTER MIDNIGHT THE NIGHT BEFORE YOUR SURGERY.  YOU MAY BRUSH YOUR TEETH, RINSE OUT YOUR MOUTH--BUT NO WATER, NO FOOD, NO CHEWING GUM, NO MINTS, NO CANDIES, NO CHEWING TOBACCO.  PLEASE TAKE THE FOLLOWING MEDICATIONS THE AM OF YOUR SURGERY WITH A FEW SIPS OF WATER:  NO MEDICATIONS TO TAKE   DO NOT BRING VALUABLES, MONEY, CREDIT CARDS.  DO NOT WEAR JEWELRY, MAKE-UP, NAIL POLISH AND NO METAL PINS OR CLIPS IN YOUR HAIR. CONTACT LENS, DENTURES / PARTIALS, GLASSES SHOULD NOT BE WORN TO SURGERY AND IN MOST CASES-HEARING AIDS WILL NEED TO BE REMOVED.  BRING YOUR GLASSES CASE, ANY EQUIPMENT NEEDED FOR YOUR CONTACT LENS. FOR PATIENTS ADMITTED TO THE HOSPITAL--CHECK OUT TIME THE DAY OF DISCHARGE IS 11:00 AM.  ALL INPATIENT ROOMS ARE PRIVATE - WITH BATHROOM, TELEPHONE, TELEVISION AND WIFI INTERNET.                                                   PLEASE READ OVER ANY  FACT SHEETS THAT YOU WERE GIVEN: MRSA INFORMATION, BLOOD TRANSFUSION INFORMATION, INCENTIVE SPIROMETER INFORMATION.  PLEASE BE AWARE THAT YOU MAY NEED ADDITIONAL BLOOD DRAWN DAY OF YOUR SURGERY  _______________________________________________________________________   [facility] - Preparing for Surgery Before surgery, you can play an important role.  Because skin is not sterile, your skin needs to be as free of germs as possible.  You can reduce the number of germs on your skin by washing with CHG (chlorahexidine gluconate) soap before surgery.  CHG is an antiseptic cleaner which kills germs and bonds with the skin to continue killing germs even after washing. Please DO NOT use if you have an allergy to CHG or antibacterial soaps.  If your skin becomes reddened/irritated stop using  the CHG and inform your nurse when you arrive at Short Stay. Do not shave (including legs and underarms) for at least 48 hours prior to the first CHG shower.  You may shave your face/neck. Please follow these instructions carefully:  1.  Shower with CHG Soap the night before surgery and the  morning of Surgery.  2.  If you choose to wash your hair, wash your hair first as usual with your  normal  shampoo.  3.  After you shampoo, rinse your hair and body thoroughly to remove the  shampoo.                           4.  Use CHG as you would any other liquid soap.  You can apply chg directly  to the skin and wash                       Gently with a scrungie or clean washcloth.  5.  Apply the CHG Soap to your body ONLY FROM THE NECK DOWN.   Do not use on face/ open  Wound or open sores. Avoid contact with eyes, ears mouth and genitals (private parts).                       Wash face,  Genitals (private parts) with your normal soap.             6.  Wash thoroughly, paying special attention to the area where your surgery  will be performed.  7.  Thoroughly rinse your body with warm water from the neck down.  8.  DO NOT shower/wash with your normal soap after using and rinsing off  the CHG Soap.                9.  Pat yourself dry with a clean towel.            10.  Wear clean pajamas.            11.  Place clean sheets on your bed the night of your first shower and do not  sleep with pets. Day of Surgery : Do not apply any lotions/deodorants the morning of surgery.  Please wear clean clothes to the hospital/surgery center.  FAILURE TO FOLLOW THESE INSTRUCTIONS MAY RESULT IN THE CANCELLATION OF YOUR SURGERY PATIENT SIGNATURE_________________________________  NURSE SIGNATURE__________________________________  ________________________________________________________________________  WHAT IS A BLOOD TRANSFUSION? Blood Transfusion Information  A transfusion is the replacement  of blood or some of its parts. Blood is made up of multiple cells which provide different functions.  Red blood cells carry oxygen and are used for blood loss replacement.  White blood cells fight against infection.  Platelets control bleeding.  Plasma helps clot blood.  Other blood products are available for specialized needs, such as hemophilia or other clotting disorders. BEFORE THE TRANSFUSION  Who gives blood for transfusions?   Healthy volunteers who are fully evaluated to make sure their blood is safe. This is blood bank blood. Transfusion therapy is the safest it has ever been in the practice of medicine. Before blood is taken from a donor, a complete history is taken to make sure that person has no history of diseases nor engages in risky social behavior (examples are intravenous drug use or sexual activity with multiple partners). The donor's travel history is screened to minimize risk of transmitting infections, such as malaria. The donated blood is tested for signs of infectious diseases, such as HIV and hepatitis. The blood is then tested to be sure it is compatible with you in order to minimize the chance of a transfusion reaction. If you or a relative donates blood, this is often done in anticipation of surgery and is not appropriate for emergency situations. It takes many days to process the donated blood. RISKS AND COMPLICATIONS Although transfusion therapy is very safe and saves many lives, the main dangers of transfusion include:   Getting an infectious disease.  Developing a transfusion reaction. This is an allergic reaction to something in the blood you were given. Every precaution is taken to prevent this. The decision to have a blood transfusion has been considered carefully by your caregiver before blood is given. Blood is not given unless the benefits outweigh the risks. AFTER THE TRANSFUSION  Right after receiving a blood transfusion, you will usually feel much  better and more energetic. This is especially true if your red blood cells have gotten low (anemic). The transfusion raises the level of the red blood cells which carry oxygen, and this usually causes an energy increase.  The nurse administering the transfusion will monitor you carefully for complications. HOME CARE INSTRUCTIONS  No special instructions are needed after a transfusion. You may find your energy is better. Speak with your caregiver about any limitations on activity for underlying diseases you may have. SEEK MEDICAL CARE IF:   Your condition is not improving after your transfusion.  You develop redness or irritation at the intravenous (IV) site. SEEK IMMEDIATE MEDICAL CARE IF:  Any of the following symptoms occur over the next 12 hours:  Shaking chills.  You have a temperature by mouth above 102 F (38.9 C), not controlled by medicine.  Chest, back, or muscle pain.  People around you feel you are not acting correctly or are confused.  Shortness of breath or difficulty breathing.  Dizziness and fainting.  You get a rash or develop hives.  You have a decrease in urine output.  Your urine turns a dark color or changes to pink, red, or brown. Any of the following symptoms occur over the next 10 days:  You have a temperature by mouth above 102 F (38.9 C), not controlled by medicine.  Shortness of breath.  Weakness after normal activity.  The white part of the eye turns yellow (jaundice).  You have a decrease in the amount of urine or are urinating less often.  Your urine turns a dark color or changes to pink, red, or brown. Document Released: 01/26/2000 Document Revised: 04/22/2011 Document Reviewed: 09/14/2007 [facility] Patient Information 2014 [place], [place].  _______________________________________________________________________

## 2013-09-29 NOTE — Anesthesia Preprocedure Evaluation (Addendum)
Anesthesia Evaluation  Patient identified by MRN, date of birth, ID band Patient awake    Reviewed: Allergy & Precautions, H&P , NPO status , Patient's Chart, lab work & pertinent test results  Airway Mallampati: II TM Distance: >3 FB Neck ROM: Full    Dental no notable dental hx.    Pulmonary neg pulmonary ROS,  breath sounds clear to auscultation  Pulmonary exam normal       Cardiovascular negative cardio ROS  Rhythm:Regular Rate:Normal     Neuro/Psych negative neurological ROS  negative psych ROS   GI/Hepatic negative GI ROS, Neg liver ROS,   Endo/Other  negative endocrine ROS  Renal/GU negative Renal ROS  negative genitourinary   Musculoskeletal negative musculoskeletal ROS (+)   Abdominal   Peds negative pediatric ROS (+)  Hematology negative hematology ROS (+)   Anesthesia Other Findings   Reproductive/Obstetrics negative OB ROS                          Anesthesia Physical Anesthesia Plan  ASA: II  Anesthesia Plan: General   Post-op Pain Management:    Induction: Intravenous  Airway Management Planned: Oral ETT  Additional Equipment: Arterial line  Intra-op Plan:   Post-operative Plan: Extubation in OR  Informed Consent: I have reviewed the patients History and Physical, chart, labs and discussed the procedure including the risks, benefits and alternatives for the proposed anesthesia with the patient or authorized representative who has indicated his/her understanding and acceptance.   Dental advisory given  Plan Discussed with: CRNA and Surgeon  Anesthesia Plan Comments:         Anesthesia Quick Evaluation

## 2013-09-30 ENCOUNTER — Encounter (HOSPITAL_COMMUNITY): Payer: Self-pay | Admitting: *Deleted

## 2013-09-30 ENCOUNTER — Inpatient Hospital Stay (HOSPITAL_COMMUNITY): Payer: Self-pay | Admitting: Anesthesiology

## 2013-09-30 ENCOUNTER — Inpatient Hospital Stay (HOSPITAL_COMMUNITY)
Admission: RE | Admit: 2013-09-30 | Discharge: 2013-10-12 | DRG: 356 | Disposition: A | Discharge status: Home / Self Care | Payer: Self-pay | Source: Ambulatory Visit | Attending: General Surgery | Admitting: General Surgery

## 2013-09-30 ENCOUNTER — Encounter (HOSPITAL_COMMUNITY)
Admission: RE | Disposition: A | Discharge status: Home / Self Care | Payer: Self-pay | Source: Ambulatory Visit | Attending: General Surgery

## 2013-09-30 ENCOUNTER — Encounter (HOSPITAL_COMMUNITY): Payer: Self-pay | Admitting: Anesthesiology

## 2013-09-30 DIAGNOSIS — K929 Disease of digestive system, unspecified: Secondary | ICD-10-CM | POA: Diagnosis not present

## 2013-09-30 DIAGNOSIS — D1803 Hemangioma of intra-abdominal structures: Principal | ICD-10-CM | POA: Diagnosis present

## 2013-09-30 DIAGNOSIS — K811 Chronic cholecystitis: Secondary | ICD-10-CM | POA: Diagnosis present

## 2013-09-30 DIAGNOSIS — E785 Hyperlipidemia, unspecified: Secondary | ICD-10-CM | POA: Diagnosis present

## 2013-09-30 DIAGNOSIS — D134 Benign neoplasm of liver: Secondary | ICD-10-CM

## 2013-09-30 DIAGNOSIS — K7689 Other specified diseases of liver: Secondary | ICD-10-CM | POA: Diagnosis present

## 2013-09-30 DIAGNOSIS — R1011 Right upper quadrant pain: Secondary | ICD-10-CM | POA: Diagnosis present

## 2013-09-30 DIAGNOSIS — Z833 Family history of diabetes mellitus: Secondary | ICD-10-CM

## 2013-09-30 DIAGNOSIS — D62 Acute posthemorrhagic anemia: Secondary | ICD-10-CM | POA: Diagnosis not present

## 2013-09-30 DIAGNOSIS — R197 Diarrhea, unspecified: Secondary | ICD-10-CM | POA: Diagnosis not present

## 2013-09-30 DIAGNOSIS — D135 Benign neoplasm of extrahepatic bile ducts: Secondary | ICD-10-CM

## 2013-09-30 DIAGNOSIS — F411 Generalized anxiety disorder: Secondary | ICD-10-CM | POA: Diagnosis present

## 2013-09-30 DIAGNOSIS — R000 Tachycardia, unspecified: Secondary | ICD-10-CM | POA: Diagnosis not present

## 2013-09-30 DIAGNOSIS — K219 Gastro-esophageal reflux disease without esophagitis: Secondary | ICD-10-CM | POA: Diagnosis present

## 2013-09-30 DIAGNOSIS — Y836 Removal of other organ (partial) (total) as the cause of abnormal reaction of the patient, or of later complication, without mention of misadventure at the time of the procedure: Secondary | ICD-10-CM | POA: Diagnosis not present

## 2013-09-30 DIAGNOSIS — J189 Pneumonia, unspecified organism: Secondary | ICD-10-CM | POA: Diagnosis not present

## 2013-09-30 HISTORY — PX: LAPAROSCOPIC PARTIAL HEPATECTOMY: SHX5909

## 2013-09-30 LAB — POCT I-STAT 7, (LYTES, BLD GAS, ICA,H+H)
ACID-BASE DEFICIT: 8 mmol/L — AB (ref 0.0–2.0)
Bicarbonate: 18.4 mEq/L — ABNORMAL LOW (ref 20.0–24.0)
Calcium, Ion: 0.96 mmol/L — ABNORMAL LOW (ref 1.12–1.23)
HEMATOCRIT: 26 % — AB (ref 36.0–46.0)
HEMOGLOBIN: 8.8 g/dL — AB (ref 12.0–15.0)
O2 Saturation: 100 %
PH ART: 7.249 — AB (ref 7.350–7.450)
POTASSIUM: 5 meq/L (ref 3.7–5.3)
Patient temperature: 36.5
Sodium: 139 mEq/L (ref 137–147)
TCO2: 20 mmol/L (ref 0–100)
pCO2 arterial: 41.7 mmHg (ref 35.0–45.0)
pO2, Arterial: 457 mmHg — ABNORMAL HIGH (ref 80.0–100.0)

## 2013-09-30 LAB — CBC
HEMATOCRIT: 37.7 % (ref 36.0–46.0)
Hemoglobin: 13.1 g/dL (ref 12.0–15.0)
MCH: 29.8 pg (ref 26.0–34.0)
MCHC: 34.7 g/dL (ref 30.0–36.0)
MCV: 85.7 fL (ref 78.0–100.0)
PLATELETS: 107 10*3/uL — AB (ref 150–400)
RBC: 4.4 MIL/uL (ref 3.87–5.11)
RDW: 13.5 % (ref 11.5–15.5)
WBC: 19.9 10*3/uL — ABNORMAL HIGH (ref 4.0–10.5)

## 2013-09-30 LAB — POCT I-STAT 4, (NA,K, GLUC, HGB,HCT)
GLUCOSE: 219 mg/dL — AB (ref 70–99)
HCT: 21 % — ABNORMAL LOW (ref 36.0–46.0)
HEMOGLOBIN: 7.1 g/dL — AB (ref 12.0–15.0)
POTASSIUM: 4 meq/L (ref 3.7–5.3)
SODIUM: 140 meq/L (ref 137–147)

## 2013-09-30 LAB — PREPARE RBC (CROSSMATCH)

## 2013-09-30 SURGERY — HEPATECTOMY, PARTIAL, LAPAROSCOPIC
Anesthesia: General | Laterality: Right

## 2013-09-30 MED ORDER — SODIUM CHLORIDE 0.9 % IJ SOLN: 9.0000 mL | INTRAMUSCULAR | Status: DC | PRN

## 2013-09-30 MED ORDER — CALCIUM CHLORIDE 10 % IV SOLN
INTRAVENOUS | Status: AC
  Filled 2013-09-30: qty 10

## 2013-09-30 MED ORDER — BUPIVACAINE-EPINEPHRINE 0.25% -1:200000 IJ SOLN
INTRAMUSCULAR | Status: AC
  Filled 2013-09-30: qty 1

## 2013-09-30 MED ORDER — SODIUM CHLORIDE 0.9 % IV SOLN: Freq: Once | INTRAVENOUS | Status: DC

## 2013-09-30 MED ORDER — NEOSTIGMINE METHYLSULFATE 10 MG/10ML IV SOLN
INTRAVENOUS | Status: DC | PRN
  Administered 2013-09-30: 3.5 mg via INTRAVENOUS

## 2013-09-30 MED ORDER — ATROPINE SULFATE 0.4 MG/ML IJ SOLN
INTRAMUSCULAR | Status: DC | PRN
  Administered 2013-09-30: .5 mg via INTRAVENOUS

## 2013-09-30 MED ORDER — SODIUM CHLORIDE 0.9 % IV SOLN
Freq: Once | INTRAVENOUS | Status: AC
  Administered 2013-09-30: 14:00:00 via INTRAVENOUS

## 2013-09-30 MED ORDER — KCL IN DEXTROSE-NACL 20-5-0.45 MEQ/L-%-% IV SOLN
INTRAVENOUS | Status: DC
  Administered 2013-09-30 – 2013-10-11 (×24): via INTRAVENOUS
  Filled 2013-09-30 (×27): qty 1000

## 2013-09-30 MED ORDER — DEXAMETHASONE SODIUM PHOSPHATE 10 MG/ML IJ SOLN
INTRAMUSCULAR | Status: AC
  Filled 2013-09-30: qty 1

## 2013-09-30 MED ORDER — FENTANYL CITRATE 0.05 MG/ML IJ SOLN
INTRAMUSCULAR | Status: AC
  Filled 2013-09-30: qty 5

## 2013-09-30 MED ORDER — CEFAZOLIN SODIUM-DEXTROSE 2-3 GM-% IV SOLR
INTRAVENOUS | Status: AC
  Filled 2013-09-30: qty 50

## 2013-09-30 MED ORDER — NALOXONE HCL 0.4 MG/ML IJ SOLN: 0.4000 mg | INTRAMUSCULAR | Status: DC | PRN

## 2013-09-30 MED ORDER — MORPHINE SULFATE 2 MG/ML IJ SOLN
1.0000 mg | INTRAMUSCULAR | Status: DC | PRN
  Administered 2013-10-01: 2 mg via INTRAVENOUS
  Filled 2013-09-30: qty 1

## 2013-09-30 MED ORDER — HYDROMORPHONE HCL PF 1 MG/ML IJ SOLN
INTRAMUSCULAR | Status: DC | PRN
  Administered 2013-09-30 (×2): 0.5 mg via INTRAVENOUS

## 2013-09-30 MED ORDER — LACTATED RINGERS IV SOLN
INTRAVENOUS | Status: DC | PRN
  Administered 2013-09-30 (×3): via INTRAVENOUS

## 2013-09-30 MED ORDER — FENTANYL CITRATE 0.05 MG/ML IJ SOLN
INTRAMUSCULAR | Status: DC | PRN
  Administered 2013-09-30 (×2): 50 ug via INTRAVENOUS
  Administered 2013-09-30: 25 ug via INTRAVENOUS
  Administered 2013-09-30 (×4): 50 ug via INTRAVENOUS
  Administered 2013-09-30: 25 ug via INTRAVENOUS
  Administered 2013-09-30: 50 ug via INTRAVENOUS

## 2013-09-30 MED ORDER — ALBUMIN HUMAN 5 % IV SOLN
INTRAVENOUS | Status: DC | PRN
  Administered 2013-09-30: 09:00:00 via INTRAVENOUS

## 2013-09-30 MED ORDER — ALBUMIN HUMAN 5 % IV SOLN
INTRAVENOUS | Status: AC
Start: 2013-09-30 — End: 2013-09-30
  Filled 2013-09-30: qty 250

## 2013-09-30 MED ORDER — PHENYLEPHRINE HCL 10 MG/ML IJ SOLN
10.0000 mg | INTRAVENOUS | Status: DC | PRN
  Administered 2013-09-30: 40 ug/min via INTRAVENOUS

## 2013-09-30 MED ORDER — LACTATED RINGERS IV SOLN
INTRAVENOUS | Status: DC | PRN
  Administered 2013-09-30 (×2): via INTRAVENOUS

## 2013-09-30 MED ORDER — PROMETHAZINE HCL 25 MG/ML IJ SOLN: 6.2500 mg | INTRAMUSCULAR | Status: DC | PRN

## 2013-09-30 MED ORDER — CISATRACURIUM BESYLATE 20 MG/10ML IV SOLN
INTRAVENOUS | Status: AC
  Filled 2013-09-30: qty 10

## 2013-09-30 MED ORDER — HYDROMORPHONE HCL PF 1 MG/ML IJ SOLN
0.2500 mg | INTRAMUSCULAR | Status: DC | PRN
  Administered 2013-09-30 (×2): 0.25 mg via INTRAVENOUS
  Administered 2013-09-30 (×2): 0.5 mg via INTRAVENOUS

## 2013-09-30 MED ORDER — TISSEEL VH 10 ML EX KIT
PACK | CUTANEOUS | Status: AC
  Filled 2013-09-30: qty 1

## 2013-09-30 MED ORDER — 0.9 % SODIUM CHLORIDE (POUR BTL) OPTIME
TOPICAL | Status: DC | PRN
  Administered 2013-09-30: 3000 mL

## 2013-09-30 MED ORDER — MORPHINE SULFATE (PF) 1 MG/ML IV SOLN
INTRAVENOUS | Status: DC
  Administered 2013-09-30: 1.5 mg via INTRAVENOUS
  Administered 2013-09-30: via INTRAVENOUS
  Administered 2013-09-30: 7.5 mg via INTRAVENOUS
  Administered 2013-09-30: 6 mg via INTRAVENOUS
  Administered 2013-10-01: 8.7 mg via INTRAVENOUS
  Administered 2013-10-01: 6 mg via INTRAVENOUS
  Administered 2013-10-01: 7.5 mg via INTRAVENOUS
  Administered 2013-10-01: 3 mg via INTRAVENOUS
  Administered 2013-10-01: 10:00:00 via INTRAVENOUS
  Administered 2013-10-01: 13.5 mg via INTRAVENOUS
  Administered 2013-10-02: 3 mg via INTRAVENOUS
  Administered 2013-10-02: 5.66 mg via INTRAVENOUS
  Administered 2013-10-02: 3 mg via INTRAVENOUS
  Administered 2013-10-02: 06:00:00 via INTRAVENOUS
  Administered 2013-10-02: 4.5 mg via INTRAVENOUS
  Administered 2013-10-02 – 2013-10-03 (×3): 3 mg via INTRAVENOUS
  Administered 2013-10-03: 3.4 mg via INTRAVENOUS
  Administered 2013-10-03: 13:00:00 via INTRAVENOUS
  Administered 2013-10-03: 9 mg via INTRAVENOUS
  Administered 2013-10-03: 1.5 mg via INTRAVENOUS
  Administered 2013-10-04: 4.83 mg via INTRAVENOUS
  Administered 2013-10-04: 4.5 mg via INTRAVENOUS
  Administered 2013-10-04: 3 mg via INTRAVENOUS
  Administered 2013-10-04: 6 mg via INTRAVENOUS
  Administered 2013-10-04: 8.34 mg via INTRAVENOUS
  Administered 2013-10-04: 6 mg via INTRAVENOUS
  Administered 2013-10-05: 3 mg via INTRAVENOUS
  Administered 2013-10-05: 7.5 mg via INTRAVENOUS
  Administered 2013-10-05: 9 mg via INTRAVENOUS
  Administered 2013-10-05: 3 mg via INTRAVENOUS
  Administered 2013-10-05: 6 mg via INTRAVENOUS
  Administered 2013-10-05: 1.5 mg via INTRAVENOUS
  Administered 2013-10-06: 4.5 mg via INTRAVENOUS
  Administered 2013-10-06: 3 mg via INTRAVENOUS
  Administered 2013-10-06: 3.34 mg via INTRAVENOUS
  Administered 2013-10-06: 3 mg via INTRAVENOUS
  Filled 2013-09-30 (×7): qty 25

## 2013-09-30 MED ORDER — HYDROMORPHONE HCL PF 2 MG/ML IJ SOLN
INTRAMUSCULAR | Status: AC
  Filled 2013-09-30: qty 1

## 2013-09-30 MED ORDER — ONDANSETRON HCL 4 MG/2ML IJ SOLN
4.0000 mg | Freq: Four times a day (QID) | INTRAMUSCULAR | Status: DC | PRN
  Administered 2013-10-05 – 2013-10-10 (×5): 4 mg via INTRAVENOUS
  Filled 2013-09-30 (×5): qty 2

## 2013-09-30 MED ORDER — CETYLPYRIDINIUM CHLORIDE 0.05 % MT LIQD
7.0000 mL | Freq: Two times a day (BID) | OROMUCOSAL | Status: DC
  Administered 2013-10-01 – 2013-10-05 (×7): 7 mL via OROMUCOSAL

## 2013-09-30 MED ORDER — DIPHENHYDRAMINE HCL 12.5 MG/5ML PO ELIX: 12.5000 mg | ORAL_SOLUTION | Freq: Four times a day (QID) | ORAL | Status: DC | PRN

## 2013-09-30 MED ORDER — TISSEEL VH 10 ML EX KIT
PACK | CUTANEOUS | Status: DC | PRN
  Administered 2013-09-30: 10 mL

## 2013-09-30 MED ORDER — LACTATED RINGERS IV BOLUS (SEPSIS): 1000.0000 mL | Freq: Once | INTRAVENOUS | Status: DC

## 2013-09-30 MED ORDER — CISATRACURIUM BESYLATE (PF) 10 MG/5ML IV SOLN
INTRAVENOUS | Status: DC | PRN
  Administered 2013-09-30: 6 mg via INTRAVENOUS
  Administered 2013-09-30 (×2): 2 mg via INTRAVENOUS

## 2013-09-30 MED ORDER — PHENYLEPHRINE HCL 10 MG/ML IJ SOLN
INTRAMUSCULAR | Status: AC
  Filled 2013-09-30: qty 1

## 2013-09-30 MED ORDER — ONDANSETRON HCL 4 MG PO TABS: 4.0000 mg | ORAL_TABLET | Freq: Four times a day (QID) | ORAL | Status: DC | PRN

## 2013-09-30 MED ORDER — DIPHENHYDRAMINE HCL 50 MG/ML IJ SOLN: 12.5000 mg | Freq: Four times a day (QID) | INTRAMUSCULAR | Status: DC | PRN

## 2013-09-30 MED ORDER — SODIUM CHLORIDE 0.9 % IV BOLUS (SEPSIS)
500.0000 mL | Freq: Once | INTRAVENOUS | Status: AC
  Administered 2013-09-30: 500 mL via INTRAVENOUS

## 2013-09-30 MED ORDER — ONDANSETRON HCL 4 MG/2ML IJ SOLN
INTRAMUSCULAR | Status: AC
  Filled 2013-09-30: qty 2

## 2013-09-30 MED ORDER — SUCCINYLCHOLINE CHLORIDE 20 MG/ML IJ SOLN
INTRAMUSCULAR | Status: DC | PRN
  Administered 2013-09-30: 100 mg via INTRAVENOUS

## 2013-09-30 MED ORDER — SODIUM CHLORIDE 0.9 % IJ SOLN
INTRAMUSCULAR | Status: AC
  Filled 2013-09-30: qty 10

## 2013-09-30 MED ORDER — HYDROMORPHONE HCL PF 1 MG/ML IJ SOLN
INTRAMUSCULAR | Status: AC
  Filled 2013-09-30: qty 1

## 2013-09-30 MED ORDER — PROPOFOL 10 MG/ML IV BOLUS
INTRAVENOUS | Status: AC
  Filled 2013-09-30: qty 20

## 2013-09-30 MED ORDER — EPHEDRINE SULFATE 50 MG/ML IJ SOLN
INTRAMUSCULAR | Status: AC
  Filled 2013-09-30: qty 1

## 2013-09-30 MED ORDER — MORPHINE SULFATE (PF) 1 MG/ML IV SOLN
INTRAVENOUS | Status: AC
  Filled 2013-09-30: qty 25

## 2013-09-30 MED ORDER — CEFAZOLIN SODIUM-DEXTROSE 2-3 GM-% IV SOLR
2.0000 g | INTRAVENOUS | Status: AC
  Administered 2013-09-30: 2 g via INTRAVENOUS

## 2013-09-30 MED ORDER — BUPIVACAINE 0.25 % ON-Q PUMP DUAL CATH 300 ML
300.0000 mL | INJECTION | Status: DC
Start: 2013-09-30 — End: 2013-09-30
  Administered 2013-09-30: 300 mL
  Filled 2013-09-30: qty 300

## 2013-09-30 MED ORDER — LIDOCAINE HCL (CARDIAC) 20 MG/ML IV SOLN
INTRAVENOUS | Status: AC
  Filled 2013-09-30: qty 5

## 2013-09-30 MED ORDER — BUPIVACAINE ON-Q PAIN PUMP (FOR ORDER SET NO CHG)
INJECTION | Status: DC
  Filled 2013-09-30: qty 1

## 2013-09-30 MED ORDER — SODIUM CHLORIDE 0.9 % IV SOLN
Freq: Once | INTRAVENOUS | Status: AC
  Administered 2013-09-30 (×2): via INTRAVENOUS

## 2013-09-30 MED ORDER — LIDOCAINE HCL (CARDIAC) 20 MG/ML IV SOLN
INTRAVENOUS | Status: DC | PRN
  Administered 2013-09-30: 70 mg via INTRAVENOUS

## 2013-09-30 MED ORDER — CALCIUM CHLORIDE 10 % IV SOLN
INTRAVENOUS | Status: DC | PRN
  Administered 2013-09-30: 1 g via INTRAVENOUS

## 2013-09-30 MED ORDER — HYDROMORPHONE HCL PF 1 MG/ML IJ SOLN
INTRAMUSCULAR | Status: AC
Start: 2013-09-30 — End: 2013-09-30
  Filled 2013-09-30: qty 1

## 2013-09-30 MED ORDER — ACETAMINOPHEN 10 MG/ML IV SOLN
1000.0000 mg | Freq: Once | INTRAVENOUS | Status: AC
  Administered 2013-09-30: 1000 mg via INTRAVENOUS
  Filled 2013-09-30: qty 100

## 2013-09-30 MED ORDER — MIDAZOLAM HCL 5 MG/5ML IJ SOLN
INTRAMUSCULAR | Status: DC | PRN
  Administered 2013-09-30: 2 mg via INTRAVENOUS

## 2013-09-30 MED ORDER — PHENYLEPHRINE HCL 10 MG/ML IJ SOLN
INTRAMUSCULAR | Status: AC
  Filled 2013-09-30: qty 2

## 2013-09-30 MED ORDER — LIDOCAINE HCL 1 % IJ SOLN
INTRAMUSCULAR | Status: AC
  Filled 2013-09-30: qty 20

## 2013-09-30 MED ORDER — GLYCOPYRROLATE 0.2 MG/ML IJ SOLN
INTRAMUSCULAR | Status: DC | PRN
  Administered 2013-09-30: 0.4 mg via INTRAVENOUS

## 2013-09-30 MED ORDER — DEXAMETHASONE SODIUM PHOSPHATE 10 MG/ML IJ SOLN
INTRAMUSCULAR | Status: DC | PRN
  Administered 2013-09-30: 10 mg via INTRAVENOUS

## 2013-09-30 MED ORDER — ATROPINE SULFATE 0.4 MG/ML IJ SOLN
INTRAMUSCULAR | Status: AC
  Filled 2013-09-30: qty 1

## 2013-09-30 MED ORDER — ONDANSETRON HCL 4 MG/2ML IJ SOLN: 4.0000 mg | Freq: Four times a day (QID) | INTRAMUSCULAR | Status: DC | PRN

## 2013-09-30 MED ORDER — PHENYLEPHRINE 40 MCG/ML (10ML) SYRINGE FOR IV PUSH (FOR BLOOD PRESSURE SUPPORT)
PREFILLED_SYRINGE | INTRAVENOUS | Status: AC
  Filled 2013-09-30: qty 10

## 2013-09-30 MED ORDER — PROPOFOL 10 MG/ML IV BOLUS
INTRAVENOUS | Status: DC | PRN
  Administered 2013-09-30: 130 mg via INTRAVENOUS

## 2013-09-30 MED ORDER — MIDAZOLAM HCL 2 MG/2ML IJ SOLN
INTRAMUSCULAR | Status: AC
Start: 2013-09-30 — End: 2013-09-30
  Filled 2013-09-30: qty 2

## 2013-09-30 MED ORDER — CHLORHEXIDINE GLUCONATE 0.12 % MT SOLN
15.0000 mL | Freq: Two times a day (BID) | OROMUCOSAL | Status: DC
  Administered 2013-09-30 – 2013-10-04 (×7): 15 mL via OROMUCOSAL
  Filled 2013-09-30 (×12): qty 15

## 2013-09-30 MED ORDER — EPHEDRINE SULFATE 50 MG/ML IJ SOLN
INTRAMUSCULAR | Status: DC | PRN
  Administered 2013-09-30 (×3): 10 mg via INTRAVENOUS

## 2013-09-30 MED ORDER — CEFAZOLIN SODIUM 1-5 GM-% IV SOLN
1.0000 g | Freq: Four times a day (QID) | INTRAVENOUS | Status: AC
  Administered 2013-09-30 – 2013-10-01 (×3): 1 g via INTRAVENOUS
  Filled 2013-09-30 (×5): qty 50

## 2013-09-30 SURGICAL SUPPLY — 103 items
BLADE 11 SAFETY STRL DISP (BLADE) ×3 IMPLANT
BLADE EXTENDED COATED 6.5IN (ELECTRODE) ×3 IMPLANT
BLADE HEX COATED 2.75 (ELECTRODE) ×3 IMPLANT
BLADE SURG SZ10 CARB STEEL (BLADE) IMPLANT
BOOT SUTURE VASCULAR YLW (MISCELLANEOUS) ×2
CANISTER SUCTION 2500CC (MISCELLANEOUS) IMPLANT
CATH KIT ON Q 5IN SLV (PAIN MANAGEMENT) ×6 IMPLANT
CATH KIT ON Q 7.5IN SLV (PAIN MANAGEMENT) IMPLANT
CHLORAPREP W/TINT 26ML (MISCELLANEOUS) ×3 IMPLANT
CLAMP SUTURE YELLOW 5 PAIRS (MISCELLANEOUS) ×1 IMPLANT
CLIP LIGATING HEM O LOK PURPLE (MISCELLANEOUS) ×9 IMPLANT
CLIP LIGATING HEMO O LOK GREEN (MISCELLANEOUS) ×3 IMPLANT
CLIP LIGATING HEMOLOK MED (MISCELLANEOUS) ×3 IMPLANT
CLIP TI LARGE 6 (CLIP) ×9 IMPLANT
CLIP TI MEDIUM 6 (CLIP) ×6 IMPLANT
DECANTER SPIKE VIAL GLASS SM (MISCELLANEOUS) IMPLANT
DISSECTOR ROUND CHERRY 3/8 STR (MISCELLANEOUS) IMPLANT
DRAIN CHANNEL 19F RND (DRAIN) ×3 IMPLANT
DRAPE C-ARM 42X120 X-RAY (DRAPES) IMPLANT
DRAPE CAMERA CLOSED 9X96 (DRAPES) ×3 IMPLANT
DRAPE LG THREE QUARTER DISP (DRAPES) IMPLANT
DRAPE UTILITY XL STRL (DRAPES) ×6 IMPLANT
DRAPE WARM FLUID 44X44 (DRAPE) ×6 IMPLANT
DRESSING SURGICEL FIBRLLR 1X2 (HEMOSTASIS) ×3 IMPLANT
DRESSING TELFA ISLAND 4X8 (GAUZE/BANDAGES/DRESSINGS) ×3 IMPLANT
DRSG PAD ABDOMINAL 8X10 ST (GAUZE/BANDAGES/DRESSINGS) IMPLANT
DRSG SURGICEL FIBRILLAR 1X2 (HEMOSTASIS) ×9
DRSG TELFA 4X10 ISLAND STR (GAUZE/BANDAGES/DRESSINGS) IMPLANT
DRSG TELFA 4X14 ISLAND ADH (GAUZE/BANDAGES/DRESSINGS) ×3 IMPLANT
DRSG TELFA PLUS 4X6 ADH ISLAND (GAUZE/BANDAGES/DRESSINGS) IMPLANT
EVACUATOR SILICONE 100CC (DRAIN) ×3 IMPLANT
GAUZE SPONGE 4X4 12PLY STRL (GAUZE/BANDAGES/DRESSINGS) IMPLANT
GAUZE SPONGE 4X4 16PLY XRAY LF (GAUZE/BANDAGES/DRESSINGS) ×3 IMPLANT
GLOVE BIO SURGEON STRL SZ 6 (GLOVE) ×3 IMPLANT
GLOVE INDICATOR 6.5 STRL GRN (GLOVE) ×6 IMPLANT
GOWN SPEC L4 XLG W/TWL (GOWN DISPOSABLE) IMPLANT
GOWN STRL REUS W/ TWL XL LVL3 (GOWN DISPOSABLE) ×5 IMPLANT
GOWN STRL REUS W/TWL 2XL LVL3 (GOWN DISPOSABLE) ×3 IMPLANT
GOWN STRL REUS W/TWL XL LVL3 (GOWN DISPOSABLE) ×10
HEMOSTAT SURGICEL 4X8 (HEMOSTASIS) IMPLANT
KIT BASIN OR (CUSTOM PROCEDURE TRAY) ×3 IMPLANT
LOOP MINI RED (MISCELLANEOUS) IMPLANT
LOOP VESSEL MAXI BLUE (MISCELLANEOUS) ×3 IMPLANT
NEEDLE BIOPSY 14GX4.5 SOFT TIS (NEEDLE) IMPLANT
NEEDLE HYPO 22GX1.5 SAFETY (NEEDLE) IMPLANT
NS IRRIG 1000ML POUR BTL (IV SOLUTION) ×9 IMPLANT
PACK GENERAL/GYN (CUSTOM PROCEDURE TRAY) ×3 IMPLANT
PACK UNIVERSAL I (CUSTOM PROCEDURE TRAY) ×3 IMPLANT
PEN SKIN MARKING BROAD (MISCELLANEOUS) ×3 IMPLANT
SCISSORS HARMONIC WAVE 18CM (INSTRUMENTS) ×3 IMPLANT
SEALER BIPOLAR AQUA 9.5 XL (INSTRUMENTS) ×3 IMPLANT
SET IRRIG TUBING LAPAROSCOPIC (IRRIGATION / IRRIGATOR) ×3 IMPLANT
SHEARS FOC LG CVD HARMONIC 17C (MISCELLANEOUS) IMPLANT
SLEEVE SURGEON STRL (DRAPES) IMPLANT
SLEEVE XCEL OPT CAN 5 100 (ENDOMECHANICALS) IMPLANT
SPONGE DRAIN TRACH 4X4 STRL 2S (GAUZE/BANDAGES/DRESSINGS) ×3 IMPLANT
SPONGE LAP 18X18 X RAY DECT (DISPOSABLE) ×12 IMPLANT
SPONGE SURGIFOAM ABS GEL 100 (HEMOSTASIS) IMPLANT
STAPLER VISISTAT 35W (STAPLE) ×3 IMPLANT
SUCTION POOLE TIP (SUCTIONS) ×3 IMPLANT
SUT CHROMIC 3 0 SH 27 (SUTURE) IMPLANT
SUT CHROMIC 4 0 RB 1X27 (SUTURE) IMPLANT
SUT ETHILON 2 0 PS N (SUTURE) ×3 IMPLANT
SUT MNCRL AB 4-0 PS2 18 (SUTURE) ×6 IMPLANT
SUT PDS AB 1 CTX 36 (SUTURE) ×9 IMPLANT
SUT PDS AB 1 TP1 54 (SUTURE) IMPLANT
SUT PDS AB 1 TP1 96 (SUTURE) ×9 IMPLANT
SUT PROLENE 3 0 SH 48 (SUTURE) ×6 IMPLANT
SUT PROLENE 3 0 SH1 36 (SUTURE) IMPLANT
SUT PROLENE 4 0 RB 1 (SUTURE) ×2
SUT PROLENE 4-0 RB1 .5 CRCL 36 (SUTURE) ×1 IMPLANT
SUT PROLENE 5 0 CC 1 (SUTURE) IMPLANT
SUT SILK 0 FSL (SUTURE) ×3 IMPLANT
SUT SILK 2 0 (SUTURE) ×2
SUT SILK 2 0 SH CR/8 (SUTURE) ×3 IMPLANT
SUT SILK 2-0 18XBRD TIE 12 (SUTURE) ×1 IMPLANT
SUT SILK 3 0 (SUTURE)
SUT SILK 3 0 SH CR/8 (SUTURE) IMPLANT
SUT SILK 3-0 18XBRD TIE 12 (SUTURE) IMPLANT
SUT VIC AB 2-0 CTX 36 (SUTURE) ×12 IMPLANT
SUT VIC AB 2-0 SH 18 (SUTURE) ×3 IMPLANT
SUT VIC AB 3-0 SH 18 (SUTURE) IMPLANT
SUT VIC AB 4-0 SH 18 (SUTURE) IMPLANT
SUT VICRYL 2 0 18  UND BR (SUTURE) ×2
SUT VICRYL 2 0 18 UND BR (SUTURE) ×1 IMPLANT
SUT VICRYL 3 0 BR 18  UND (SUTURE) ×2
SUT VICRYL 3 0 BR 18 UND (SUTURE) ×1 IMPLANT
SYR CONTROL 10ML LL (SYRINGE) IMPLANT
SYRINGE 12CC LL (MISCELLANEOUS) IMPLANT
SYS LAPSCP GELPORT 120MM (MISCELLANEOUS)
SYSTEM LAPSCP GELPORT 120MM (MISCELLANEOUS) IMPLANT
TAG SUTURE CLAMP YLW 5PR (MISCELLANEOUS) ×1
TAPE UMBILICAL COTTON 1/8X30 (MISCELLANEOUS) IMPLANT
TOWEL BLUE STERILE X RAY DET (MISCELLANEOUS) IMPLANT
TOWEL OR 17X26 10 PK STRL BLUE (TOWEL DISPOSABLE) ×6 IMPLANT
TRAY FOLEY CATH 14FRSI W/METER (CATHETERS) ×3 IMPLANT
TROCAR BLADELESS OPT 5 100 (ENDOMECHANICALS) ×3 IMPLANT
TROCAR BLADELESS OPT 5 75 (ENDOMECHANICALS) ×3 IMPLANT
TROCAR XCEL BLUNT TIP 100MML (ENDOMECHANICALS) IMPLANT
TROCAR XCEL NON-BLD 11X100MML (ENDOMECHANICALS) IMPLANT
TUBING INSUFFLATION 10FT LAP (TUBING) ×3 IMPLANT
TUNNELER SHEATH ON-Q 16GX12 DP (PAIN MANAGEMENT) ×3 IMPLANT
YANKAUER SUCT BULB TIP NO VENT (SUCTIONS) ×3 IMPLANT

## 2013-09-30 NOTE — Op Note (Signed)
PREOPERATIVE DIAGNOSIS:  Symptomatic hemangioma right liver.  POSTOPERATIVE DIAGNOSIS:  same  PROCEDURE PERFORMED:  Intraoperative liver ultrasound, partial right hepatectomy with enucleation of hemangioma, cholecystectomy  SURGEON:  Stark Klein, MD  ASSISTANT:  Armandina Gemma, MD  ANESTHESIA:  General and local.  FINDINGS:  Hemangioma posterior right liver, mild chronic cholecystitis.    SPECIMEN:  Partial right hepatectomy, gallbladder  ESTIMATED BLOOD LOSS:  900 mL.  COMPLICATIONS:  Unknown.  PROCEDURE:  Pt was identified in the holding area and taken to the operating room where she was placed supine on the operating room table.  General endotracheal anesthesia was induced.  Her abdomen was prepped and draped in a sterile fashion.  A time-out was performed according to the surgical safety check list.  When all was correct, we continued.      A right subcostal incision was made extending up to the xiphoid.   The subcutaneous tissues were divided with the Bovie and then the fascia was opened with the Bovie as well. There were several spots on the muscle that were coagulated.  The Bookwalter was then placed for assistance with visualization.  The ultrasound was used to delineate the borders of the mass.  The bovie was used to score the liver.  The Aquamantys was used to divide the hepatic parenchyma over the mass.  The mass was then mobilized digitally and with the clamp-crush technique.  The Harmonic wave was used to divide the smaller vessels.  The larger branches entering the mass were ligated with clips, sutures, and the harmonic.  The liver was fairly fatty, so it did ooze quite a bit.  The portion of liver was removed.  The liver was re-ultrasounded and the entire mass was absent, as expected.  The liver bed was cauterized with the Argon.  Fibrillar was placed over the defect and pressure was held.    Attention was then directed to the gallbladder fossa.  The gallbladder was  mildly injected and edematous, and it was extremely intrahepatic.  The overholdt and the bovie were used to take down the gallbladder.  The gallbladder was skeletonized down to the infundibulum.  The cystic artery was clipped twice, and the cystic duct was ligated with 2 locking clips then divided.  The gallbladder fossa was cauterized.  Fibrillar was also placed in the gallbladder fossa.      The argon beam coagulator was used to coagulate the liver bed. This also was irrigated.  A 19 Blake drain was placed in the abdomen. There was still no bile leakage seen after waiting and evaluating the liver bed with a dry wrap.  Tisseel was then applied to the liver edge and the Blake drain was placed going around the cut edge of the surface and over into the hepatic fossa.    Laps counts were performed and were correct. The OnQ tunnelers were placed into the pre peritoneal space.   The fascia was closed in two layers on the right with #1 looped PDS suture.  The midline portion was closed with 1 layer of #1 looped PDS.    The [place] drain was then in place with a 2-0 nylon.  The skin was then irrigated and closed with 4-0 monocryl.  The wounds were cleaned, dried and dressed with Covaderm.  The OnQ catheters were placed into the tunnelers and the tunneler sheaths were removed.      The patient was extubated and taken to PACU in stable condition.     Stark Klein, MD

## 2013-09-30 NOTE — Anesthesia Postprocedure Evaluation (Signed)
  Anesthesia Post-op Note  Patient: Deborah Trujillo  Procedure(s) Performed: Procedure(s) (LRB): PARTIAL RIGHT HEPATECTOMY; CHOLECYSTECTOMY (Right)  Patient Location: PACU  Anesthesia Type: General  Level of Consciousness: awake and alert   Airway and Oxygen Therapy: Patient Spontanous Breathing  Post-op Pain: mild  Post-op Assessment: Post-op Vital signs reviewed, Patient's Cardiovascular Status Stable, Respiratory Function Stable, Patent Airway and No signs of Nausea or vomiting  Last Vitals:  Filed Vitals:   09/30/13 1115  Pulse:   Temp: 36.4 C  Resp:     Post-op Vital Signs: stable   Complications: No apparent anesthesia complications

## 2013-09-30 NOTE — Transfer of Care (Signed)
Immediate Anesthesia Transfer of Care Note  Patient: Deborah Trujillo  Procedure(s) Performed: Procedure(s) (LRB): PARTIAL RIGHT HEPATECTOMY; CHOLECYSTECTOMY (Right)  Patient Location: PACU  Anesthesia Type: General  Level of Consciousness: sedated, patient cooperative and responds to stimulation  Airway & Oxygen Therapy: Patient Spontanous Breathing and Patient connected to face mask oxgen  Post-op Assessment: Report given to PACU RN and Post -op Vital signs reviewed and stable  Post vital signs: Reviewed and stable  Complications: No apparent anesthesia complications

## 2013-09-30 NOTE — H&P (Signed)
Deborah Trujillo is an 48 y.o. female.   Chief Complaint: hemangioma HPI:  Pt presents with around 9-12 months of right flank pain and a large hemangioma in the liver at the point of the lesion.  No n/v.    Past Medical History  Diagnosis Date  . Liver mass   . Hyperlipidemia   . Pain     PAIN - BURNING SENSATION RIGHT SIDE - BLOOD WORK ABNORMAL - FOUND TO HAVE LIVER MASS  . Colitis     Past Surgical History  Procedure Laterality Date  . Neck mass excision  2009    absess removed   . Tubal ligation      Family History  Problem Relation Age of Onset  . Diabetes Mother    Social History:  reports that she has never smoked. She has never used smokeless tobacco. She reports that she does not drink alcohol or use illicit drugs.  Allergies: No Known Allergies  No prescriptions prior to admission    No results found for this or any previous visit (from the past 48 hour(s)). No results found.  Review of Systems  All other systems reviewed and are negative.   Pulse 65, temperature 98.2 F (36.8 C), temperature source Oral, resp. rate 16, SpO2 98.00%. Physical Exam  Constitutional: She is oriented to person, place, and time. She appears well-developed and well-nourished. No distress.  HENT:  Head: Normocephalic and atraumatic.  Eyes: Conjunctivae are normal. Pupils are equal, round, and reactive to light. No scleral icterus.  Neck: Normal range of motion.  Cardiovascular: Normal rate and regular rhythm.   Respiratory: Effort normal. No respiratory distress.  GI: Soft.  Musculoskeletal: Normal range of motion.  Neurological: She is alert and oriented to person, place, and time.  Skin: Skin is warm and dry. No rash noted. She is not diaphoretic. No erythema. No pallor.  Psychiatric: She has a normal mood and affect. Her behavior is normal. Judgment and thought content normal.     Assessment/Plan Hemangioma of the liver  To OR for partial hepatectomy Type and  crossed. Plan for enucleation if possible.    Discussed surgery again with patient.    Deborah Trujillo 09/30/2013, 7:28 AM

## 2013-09-30 NOTE — Addendum Note (Signed)
Addendum created 09/30/13 1622 by Montel Clock, CRNA   Modules edited: Charges VN

## 2013-10-01 ENCOUNTER — Encounter (HOSPITAL_COMMUNITY): Payer: Self-pay | Admitting: General Surgery

## 2013-10-01 ENCOUNTER — Inpatient Hospital Stay (HOSPITAL_COMMUNITY): Payer: Self-pay

## 2013-10-01 LAB — CBC WITH DIFFERENTIAL/PLATELET
Basophils Absolute: 0 10*3/uL (ref 0.0–0.1)
Basophils Absolute: 0 10*3/uL (ref 0.0–0.1)
Basophils Relative: 0 % (ref 0–1)
Basophils Relative: 0 % (ref 0–1)
EOS ABS: 0 10*3/uL (ref 0.0–0.7)
EOS PCT: 0 % (ref 0–5)
Eosinophils Absolute: 0 10*3/uL (ref 0.0–0.7)
Eosinophils Relative: 0 % (ref 0–5)
HCT: 36.1 % (ref 36.0–46.0)
HEMATOCRIT: 35.9 % — AB (ref 36.0–46.0)
Hemoglobin: 12.9 g/dL (ref 12.0–15.0)
Hemoglobin: 13 g/dL (ref 12.0–15.0)
LYMPHS ABS: 1.9 10*3/uL (ref 0.7–4.0)
LYMPHS ABS: 1.7 10*3/uL (ref 0.7–4.0)
LYMPHS PCT: 13 % (ref 12–46)
LYMPHS PCT: 12 % (ref 12–46)
MCH: 29.9 pg (ref 26.0–34.0)
MCH: 30 pg (ref 26.0–34.0)
MCHC: 35.7 g/dL (ref 30.0–36.0)
MCHC: 36.2 g/dL — ABNORMAL HIGH (ref 30.0–36.0)
MCV: 83.6 fL (ref 78.0–100.0)
MCV: 82.9 fL (ref 78.0–100.0)
Monocytes Absolute: 1.5 10*3/uL — ABNORMAL HIGH (ref 0.1–1.0)
Monocytes Absolute: 1.2 10*3/uL — ABNORMAL HIGH (ref 0.1–1.0)
Monocytes Relative: 10 % (ref 3–12)
Monocytes Relative: 8 % (ref 3–12)
NEUTROS PCT: 78 % — AB (ref 43–77)
Neutro Abs: 11.5 10*3/uL — ABNORMAL HIGH (ref 1.7–7.7)
Neutro Abs: 11 10*3/uL — ABNORMAL HIGH (ref 1.7–7.7)
Neutrophils Relative %: 79 % — ABNORMAL HIGH (ref 43–77)
PLATELETS: 97 10*3/uL — AB (ref 150–400)
PLATELETS: 116 10*3/uL — AB (ref 150–400)
RBC: 4.32 MIL/uL (ref 3.87–5.11)
RBC: 4.33 MIL/uL (ref 3.87–5.11)
RDW: 14.2 % (ref 11.5–15.5)
RDW: 14.3 % (ref 11.5–15.5)
WBC: 14.9 10*3/uL — AB (ref 4.0–10.5)
WBC: 13.9 10*3/uL — AB (ref 4.0–10.5)

## 2013-10-01 LAB — CBC
HCT: 37.1 % (ref 36.0–46.0)
HEMOGLOBIN: 13.6 g/dL (ref 12.0–15.0)
MCH: 30.2 pg (ref 26.0–34.0)
MCHC: 36.7 g/dL — ABNORMAL HIGH (ref 30.0–36.0)
MCV: 82.4 fL (ref 78.0–100.0)
PLATELETS: 124 10*3/uL — AB (ref 150–400)
RBC: 4.5 MIL/uL (ref 3.87–5.11)
RDW: 14.1 % (ref 11.5–15.5)
WBC: 13 10*3/uL — ABNORMAL HIGH (ref 4.0–10.5)

## 2013-10-01 LAB — BASIC METABOLIC PANEL
ANION GAP: 9 (ref 5–15)
BUN: 14 mg/dL (ref 6–23)
CHLORIDE: 108 meq/L (ref 96–112)
CO2: 20 mEq/L (ref 19–32)
Calcium: 7.5 mg/dL — ABNORMAL LOW (ref 8.4–10.5)
Creatinine, Ser: 0.82 mg/dL (ref 0.50–1.10)
GFR calc Af Amer: >90 mL/min (ref >90)
GFR calc non Af Amer: 84 mL/min — ABNORMAL LOW (ref >90)
Glucose, Bld: 219 mg/dL — ABNORMAL HIGH (ref 70–99)
POTASSIUM: 5.3 meq/L (ref 3.7–5.3)
Sodium: 137 mEq/L (ref 137–147)

## 2013-10-01 LAB — PROTIME-INR
INR: 1.41 (ref 0.00–1.49)
PROTHROMBIN TIME: 17.3 s — AB (ref 11.6–15.2)

## 2013-10-01 LAB — LACTIC ACID, PLASMA: Lactic Acid, Venous: 2.4 mmol/L — ABNORMAL HIGH (ref 0.5–2.2)

## 2013-10-01 LAB — MAGNESIUM: MAGNESIUM: 1.4 mg/dL — AB (ref 1.5–2.5)

## 2013-10-01 MED ORDER — SODIUM CHLORIDE 0.9 % IV SOLN
1.0000 g | Freq: Once | INTRAVENOUS | Status: AC
  Administered 2013-10-01: 1 g via INTRAVENOUS
  Filled 2013-10-01: qty 10

## 2013-10-01 MED ORDER — METOPROLOL TARTRATE 1 MG/ML IV SOLN
5.0000 mg | Freq: Four times a day (QID) | INTRAVENOUS | Status: DC
  Administered 2013-10-01 – 2013-10-11 (×39): 5 mg via INTRAVENOUS
  Filled 2013-10-01 (×51): qty 5

## 2013-10-01 MED ORDER — SODIUM CHLORIDE 0.9 % IV BOLUS (SEPSIS)
500.0000 mL | Freq: Once | INTRAVENOUS | Status: AC
  Administered 2013-10-01: 500 mL via INTRAVENOUS

## 2013-10-01 MED ORDER — ALBUMIN HUMAN 5 % IV SOLN
25.0000 g | Freq: Once | INTRAVENOUS | Status: AC
  Administered 2013-10-01: 25 g via INTRAVENOUS
  Filled 2013-10-01: qty 500

## 2013-10-01 MED ORDER — MAGNESIUM SULFATE 4000MG/100ML IJ SOLN
4.0000 g | Freq: Once | INTRAMUSCULAR | Status: AC
  Administered 2013-10-01: 4 g via INTRAVENOUS
  Filled 2013-10-01: qty 100

## 2013-10-01 MED ORDER — SODIUM CHLORIDE 0.9 % IV BOLUS (SEPSIS)
1000.0000 mL | Freq: Once | INTRAVENOUS | Status: AC
  Administered 2013-10-01: 1000 mL via INTRAVENOUS

## 2013-10-01 NOTE — Progress Notes (Signed)
Patients HR again started jumping up at 0315, this time sustaining in the 140-150's. Otherwise patient stable with no complaints. Dr. Lucia Gaskins notified.

## 2013-10-01 NOTE — Care Management Note (Signed)
    Page 1 of 1   10/01/2013     1:24:42 PM CARE MANAGEMENT NOTE 10/01/2013  Patient:  HARTLEE, AMEDEE   Account Number:  [id number]  Date Initiated:  10/01/2013  Documentation initiated by:  Dessa Phi  Subjective/Objective Assessment:   48 Y/O F ADMITTED [facility] OF LIVER.     Action/Plan:   FROM HOME.NO INSURANCE.SPEAKS SPANISH.   Anticipated DC Date:  10/04/2013   Anticipated DC Plan:  HOME/SELF CARE  In-house referral  Interpreting Services      DC Planning Services  CM consult      Choice offered to / List presented to:             Status of service:  In process, will continue to follow Medicare Important Message given?   (If response is "NO", the following Medicare IM given date fields will be blank) Date Medicare IM given:   Medicare IM given by:   Date Additional Medicare IM given:   Additional Medicare IM given by:    Discharge Disposition:    Per UR Regulation:  Reviewed for med. necessity/level of care/duration of stay  If discussed at [facility] of Stay Meetings, dates discussed:    Comments:  10/01/13 Marsh Heckler RN,BSN NCM [place].CONTINUE TO MONITOR.

## 2013-10-01 NOTE — Progress Notes (Signed)
Wasted 2.68ml of 1mg /ml morphine in sink. Witnessed by Antony Blackbird, RN.

## 2013-10-01 NOTE — Progress Notes (Signed)
1 Day Post-Op  Subjective: Pt required multiple boluses overnight for tachycardia.  Remains with HR in 120s.  Pain 5/10.  Objective: Vital signs in last 24 hours: Temp:  [97.1 F (36.2 C)-98.5 F (36.9 C)] 98.5 F (36.9 C) (08/21 0400) Pulse Rate:  [79-140] 133 (08/21 0600) Resp:  [11-21] 20 (08/21 0600) BP: (96-145)/(69-92) 135/85 mmHg (08/21 0400) SpO2:  [92 %-100 %] 92 % (08/21 0600) Arterial Line BP: (96-138)/(64-82) 96/64 mmHg (08/20 1320) Weight:  [158 lb 11.7 oz (72 kg)] 158 lb 11.7 oz (72 kg) (08/20 1300)    Intake/Output from previous day: 08/20 0701 - 08/21 0700 In: 10303.7 [I.V.:5751.2; Blood:1352.5; IV Piggyback:2500] Out: 2935 [Urine:1445; Drains:140; Blood:1350] Intake/Output this shift:    General appearance: alert, cooperative and mild distress Resp: breathing comfortably GI: soft, approp tender Extremities: no edema  Lab Results:   Recent Labs  10/01/13 0340 10/01/13 0600  WBC 13.0* 14.9*  HGB 13.6 12.9  HCT 37.1 36.1  PLT 124* 97*   BMET  Recent Labs  09/30/13 0931 09/30/13 1000 10/01/13 0340  NA 140 139 137  K 4.0 5.0 5.3  CL  --   --  108  CO2  --   --  20  GLUCOSE 219*  --  219*  BUN  --   --  14  CREATININE  --   --  0.82  CALCIUM  --   --  7.5*   PT/INR  Recent Labs  10/01/13 0340  LABPROT 17.3*  INR 1.41   ABG  Recent Labs  09/30/13 1000  PHART 7.249*  HCO3 18.4*    Studies/Results: No results found.  Anti-infectives: Anti-infectives   Start     Dose/Rate Route Frequency Ordered Stop   09/30/13 1400  ceFAZolin (ANCEF) IVPB 1 g/50 mL premix     1 g 100 mL/hr over 30 Minutes Intravenous Every 6 hours 09/30/13 1315 10/01/13 0212   09/30/13 0608  ceFAZolin (ANCEF) IVPB 2 g/50 mL premix     2 g 100 mL/hr over 30 Minutes Intravenous On call to O.R. 09/30/13 8882 09/30/13 0755      Assessment/Plan: s/p Procedure(s): PARTIAL RIGHT HEPATECTOMY; CHOLECYSTECTOMY (Right) Continue foley due to strict I&O, patient  in ICU and urinary output monitoring Albumin Recheck CBC later today Metoprolol IS OOB today. Leave in ICU.    LOS: 1 day    [facility] 10/01/2013

## 2013-10-01 NOTE — Progress Notes (Signed)
Patient complaining of chest tightness. O2 saturation at 89-90% on 3L. Performed breathing exercises with cough and deep breathing, and also incentive spirometer. O2 saturation has not changed. Bumped patient to 4L nasal cannula and saturation is now at 91%. Contacted charge nurse, I am at bedside and will continue to monitor.

## 2013-10-01 NOTE — Progress Notes (Signed)
Spoke with MD Rosenbower. Stated he has reviewed chest DG and says it is a pulmonary tolieting issue. Encourage deep breathing, and incentive spirometer. Messaged was passed along to receiving RN.

## 2013-10-01 NOTE — Progress Notes (Signed)
Patient's HR has been in Sinus Tachy in the low 100's since start of shift but started trending up to the 120-140's around 2330. Other VSS. Patient using the PCA pump appropriately and is denying pain at this time. She states, via family, that she feels dizzy when HR jumps up. Dr. Lucia Gaskins notified and ordered for patient to receive 570ml bolus and to increase continuous fluids to 162ml/hr.  Continuing to monitor.

## 2013-10-01 NOTE — Progress Notes (Signed)
MD Rosenbower called back and requested patient received stat portable chest xray and O2 parameters of 90% or greater. Advised to call back once results came back. Will continue to monitor.

## 2013-10-01 NOTE — Progress Notes (Signed)
Called by nurse about 3:30 AM about pulse jumping to 140-150's.  Her BP was okay and she was alert, with normal complaints of post op pain. Her JP has only put out 120 cc.  I was doing an appendectomy at the time.  She was given a bolus of fluid. Her Hgb - 13.6 at 0340. Her INR is 1.41. Other labs are still pending. EKG shows a sinus tachy - but rate is down to 115.  BP 145/92  Pulse 140  Temp(Src) 97.6 F (36.4 C) (Oral)  Resp 21  Ht 4' 9.5" (1.461 m)  Wt 158 lb 11.7 oz (72 kg)  BMI 33.73 kg/m2  SpO2 95%  Lungs:  Clear, though modest inspiratory effort. Abdomen: Quiet.  But not overly distended  A&P:  1. Intraoperative liver ultrasound, partial right hepatectomy with enucleation of hemangioma, cholecystectomy - 09/30/2013 - [facility]  2.  She did drop her Hgb to 7.1 intra op - she got 3 units of blood peri operative  3.  I discussed her case with Dr. Chase Caller.  I think she is still behind in volume.   Will give bolus of IVF, increase base line IVF, repeat CBC in 3 hours  Son at bedside and I discussed findings with him.  She speaks limited [place], he speaks a little more.  Alphonsa Overall, MD, [facility] Pager: [phone] Office phone:  [phone]

## 2013-10-01 NOTE — Progress Notes (Signed)
[facility] Progress Note Patient Name: Deborah Trujillo DOB: [birth year] MRN: [id number]   Date of Service  10/01/2013  HPI/Events of Note  Askled by Dr Alphonsa Overall to camera in and discuss wit him. Patient s/p hepatectomy for benign hemangioma  Now sudden bursts of tachycardisa No fever. No pain. No visible bleeding. EKG shows sinus tachycardia. CBC repeat now seems ok   Recent Labs Lab 09/27/13 1130  09/30/13 1000 09/30/13 1154 10/01/13 0340  HGB 15.0  < > 8.8* 13.1 13.6  HCT 43.7  < > 26.0* 37.7 37.1  WBC 6.0  --   --  19.9* 13.0*  PLT 172  --   --  107* 124*  < > = values in this interval not displayed.  Other labs  Recent Labs Lab 09/27/13 1130 09/30/13 0931 09/30/13 1000  NA 142 140 139  K 4.2 4.0 5.0  CL 106  --   --   CO2 25  --   --   GLUCOSE 95 219*  --   BUN 18  --   --   CREATININE 0.77  --   --   CALCIUM 9.6  --   --    Low ionized calcium +    eICU Interventions  Unclear cause of sinus tachycardia  Plan Repeat fluid bolus Calcium gluconate Check mag and phos Check lactic Repeat cbc in few hours     Intervention Category Intermediate Interventions: Arrhythmia - evaluation and management  Regnald Bowens 10/01/2013, 4:10 AM

## 2013-10-01 NOTE — Progress Notes (Signed)
Patient's O2 saturdation 89% on 6L. Patient moved to chair to assist in expanding lungs. Charge nurse at bedside. Will switch to venti mask. Called MD Byerly and left message to call back. Awaiting call back.

## 2013-10-01 NOTE — Progress Notes (Signed)
[facility] Progress Note Patient Name: Deborah Trujillo DOB: Nov 10, 1965 MRN: [id number]   Date of Service  10/01/2013  HPI/Events of Note  Low mag 1.4  eICU Interventions  4gm mag sulfate IV     Intervention Category Minor Interventions: Electrolytes abnormality - evaluation and management  Frederich Montilla 10/01/2013, 6:52 AM

## 2013-10-02 LAB — CBC
HCT: 31.2 % — ABNORMAL LOW (ref 36.0–46.0)
Hemoglobin: 11.1 g/dL — ABNORMAL LOW (ref 12.0–15.0)
MCH: 29.8 pg (ref 26.0–34.0)
MCHC: 35.6 g/dL (ref 30.0–36.0)
MCV: 83.9 fL (ref 78.0–100.0)
Platelets: 121 10*3/uL — ABNORMAL LOW (ref 150–400)
RBC: 3.72 MIL/uL — ABNORMAL LOW (ref 3.87–5.11)
RDW: 14.3 % (ref 11.5–15.5)
WBC: 14.9 10*3/uL — AB (ref 4.0–10.5)

## 2013-10-02 LAB — BASIC METABOLIC PANEL
Anion gap: 8 (ref 5–15)
BUN: 8 mg/dL (ref 6–23)
CHLORIDE: 103 meq/L (ref 96–112)
CO2: 22 meq/L (ref 19–32)
CREATININE: 0.54 mg/dL (ref 0.50–1.10)
Calcium: 7.2 mg/dL — ABNORMAL LOW (ref 8.4–10.5)
GFR calc Af Amer: >90 mL/min (ref >90)
GFR calc non Af Amer: >90 mL/min (ref >90)
Glucose, Bld: 162 mg/dL — ABNORMAL HIGH (ref 70–99)
Potassium: 4.6 mEq/L (ref 3.7–5.3)
Sodium: 133 mEq/L — ABNORMAL LOW (ref 137–147)

## 2013-10-02 NOTE — Progress Notes (Signed)
General Surgery Note  LOS: 2 days  POD -  2 Days Post-Op  Assessment/Plan: 1.  Intraoperative liver ultrasound, partial right hepatectomy with enucleation of hemangioma, cholecystectomy - 09/30/2013 - [facility]. [facility]  Probable bile leak with high output bile stained fluid in JP  NPO except for sips from floor, has On Q  Will leave in unit at least until tomorrow  2.  Post op tachycardia  Hgb - 11.1 - 10/02/2013 3  DVT prophylaxis - on hold for blood loss, has PAS stockings.  Active Problems:   Hemangioma of liver  Subjective:  Looks better than the other night.  Has pain and some difficulty with IS.  Her son, who speaks some English, is in the room. Objective:   Filed Vitals:   10/02/13 0400  BP: 125/69  Pulse: 108  Temp: 99.5 F (37.5 C)  Resp: 20     Intake/Output from previous day:  08/21 0701 - 08/22 0700 In: 2542 [I.V.:2971; IV Piggyback:500] Out: 2240 [Urine:1525; Drains:715]  Intake/Output this shift:      Physical Exam:   General: WN Hispanic F who is alert and oriented.    HEENT: Normal. Pupils equal. .   Lungs: Poor inspiratory effort.  IS about 500 cc.  I encouraged her son to push her.   Abdomen: Soft.  Rare.   Wound: Drain - 715 recorded last 24 hours.  Wound clean.  Still has On Q pump.   Lab Results:    Recent Labs  10/01/13 1010 10/02/13 0535  WBC 13.9* 14.9*  HGB 13.0 11.1*  HCT 35.9* 31.2*  PLT 116* 121*    BMET   Recent Labs  10/01/13 0340 10/02/13 0536  NA 137 133*  K 5.3 4.6  CL 108 103  CO2 20 22  GLUCOSE 219* 162*  BUN 14 8  CREATININE 0.82 0.54  CALCIUM 7.5* 7.2*    PT/INR   Recent Labs  10/01/13 0340  LABPROT 17.3*  INR 1.41    ABG   Recent Labs  09/30/13 1000  PHART 7.249*  HCO3 18.4*     Studies/Results:  Dg Chest Port 1 View  10/01/2013   CLINICAL DATA:  Shortness of breath  EXAM: PORTABLE CHEST - 1 VIEW  COMPARISON:  PA and lateral chest of September 27, 2013  FINDINGS: There is new increased density in volume  loss on the right. The hemidiaphragm is obscured. On the left the lung is less well inflated today and there is minimal subsegmental atelectasis at the lung base. The cardiac silhouette is not enlarged. The pulmonary vascularity is not engorged.  IMPRESSION: Volume loss on the right with increased basilar density is consistent with atelectasis or pneumonia with pleural fluid. There is no definite evidence of CHF. This reflects a marked deterioration since the study 4 days ago. Marked change over the preceding 4 days.   Electronically Signed   By: Annelle Behrendt  [place]   On: 10/01/2013 15:37     Anti-infectives:   Anti-infectives   Start     Dose/Rate Route Frequency Ordered Stop   09/30/13 1400  ceFAZolin (ANCEF) IVPB 1 g/50 mL premix     1 g 100 mL/hr over 30 Minutes Intravenous Every 6 hours 09/30/13 1315 10/01/13 0212   09/30/13 0608  ceFAZolin (ANCEF) IVPB 2 g/50 mL premix     2 g 100 mL/hr over 30 Minutes Intravenous On call to O.R. 09/30/13 0608 09/30/13 0755      Alphonsa Overall, MD, [place] Pager: [phone] [facility] Office: [phone] 10/02/2013

## 2013-10-02 NOTE — Evaluation (Signed)
Physical Therapy Evaluation Patient Details Name: Deborah Trujillo MRN: [id number] DOB: 04-14-1965 Today's Date: 10/02/2013   History of Present Illness  48 yo female admitted 09/30/13 with R flank pain. s/p partial right hepatectomy with enucleation of hemangioma, cholecystectomy - 09/30/2013   Clinical Impression  Pt is mobilizing  To recliner. Should be able to ambulate once  Decrease in lines and tubes. Pt will benefit from PT to address problems listed in note.   Follow Up Recommendations No PT follow up    Equipment Recommendations  None recommended by PT    Recommendations for Other Services       Precautions / Restrictions Precautions Precaution Comments: multiple drains, lines, on VM      Mobility  Bed Mobility Overal bed mobility: Needs Assistance;+ 2 for safety/equipment Bed Mobility: Rolling;Sidelying to Sit Rolling: Min assist Sidelying to sit: Min assist       General bed mobility comments: cues for technique and for abdominal protection  Transfers Overall transfer level: Needs assistance Equipment used: 2 person hand held assist Transfers: Sit to/from Stand;Stand Pivot Transfers Sit to Stand: +2 physical assistance;+2 safety/equipment;Min assist Stand pivot transfers: +2 physical assistance;+2 safety/equipment;Min assist       General transfer comment: cues for technique and safety with all of the lines and tubes.  Ambulation/Gait                Stairs            Wheelchair Mobility    Modified Rankin (Stroke Patients Only)       Balance                                             Pertinent Vitals/Pain Pain Assessment: 0-10 Pain Score: 5  Pain Descriptors / Indicators: Discomfort Pain Intervention(s): Limited activity within patient's tolerance;Monitored during session;PCA encouraged    Home Living Family/patient expects to be discharged to:: Private residence Living Arrangements: Children Available  Help at Discharge: Family Type of Home: House Home Access: Stairs to enter Entrance Stairs-Rails: None Technical brewer of Steps: 2-3 Home Layout: One level Home Equipment: None      Prior Function Level of Independence: Independent               Hand Dominance        Extremity/Trunk Assessment   Upper Extremity Assessment: Overall WFL for tasks assessed           Lower Extremity Assessment: Generalized weakness      Cervical / Trunk Assessment: Normal  Communication   Communication:  (daughter in law interpreted\)  Cognition Arousal/Alertness: Awake/alert Behavior During Therapy: WFL for tasks assessed/performed Overall Cognitive Status: Within Functional Limits for tasks assessed                      General Comments      Exercises        Assessment/Plan    PT Assessment Patient needs continued PT services  PT Diagnosis Difficulty walking;Acute pain   PT Problem List Decreased strength;Decreased activity tolerance;Pain;Decreased mobility  PT Treatment Interventions DME instruction;Gait training;Stair training;Functional mobility training;Therapeutic exercise;Patient/family education   PT Goals (Current goals can be found in the Care Plan section) Acute Rehab PT Goals Patient Stated Goal: agreed with m obility PT Goal Formulation: With patient/family Time For Goal Achievement: 10/16/13 Potential to Achieve Goals: Good  Frequency Min 3X/week   Barriers to discharge        Co-evaluation               End of Session Equipment Utilized During Treatment: Oxygen Activity Tolerance: Patient tolerated treatment well Patient left: in chair;with call bell/phone within reach;with nursing/sitter in room;with family/visitor present Nurse Communication: Mobility status         Time: 1141-1159 PT Time Calculation (min): 18 min   Charges:   PT Evaluation $Initial PT Evaluation Tier I: 1 Procedure PT  Treatments $Therapeutic Activity: 8-22 mins   PT G Codes:          Deborah Trujillo 10/02/2013, 12:17 PM

## 2013-10-03 DIAGNOSIS — R000 Tachycardia, unspecified: Secondary | ICD-10-CM

## 2013-10-03 DIAGNOSIS — F411 Generalized anxiety disorder: Secondary | ICD-10-CM

## 2013-10-03 DIAGNOSIS — D649 Anemia, unspecified: Secondary | ICD-10-CM

## 2013-10-03 LAB — BASIC METABOLIC PANEL
Anion gap: 7 (ref 5–15)
BUN: 5 mg/dL — ABNORMAL LOW (ref 6–23)
CHLORIDE: 104 meq/L (ref 96–112)
CO2: 25 mEq/L (ref 19–32)
CREATININE: 0.56 mg/dL (ref 0.50–1.10)
Calcium: 7.5 mg/dL — ABNORMAL LOW (ref 8.4–10.5)
Glucose, Bld: 150 mg/dL — ABNORMAL HIGH (ref 70–99)
POTASSIUM: 4 meq/L (ref 3.7–5.3)
Sodium: 136 mEq/L — ABNORMAL LOW (ref 137–147)

## 2013-10-03 LAB — CBC WITH DIFFERENTIAL/PLATELET
BASOS ABS: 0 10*3/uL (ref 0.0–0.1)
BASOS PCT: 0 % (ref 0–1)
Eosinophils Absolute: 0 10*3/uL (ref 0.0–0.7)
Eosinophils Relative: 0 % (ref 0–5)
HCT: 27.4 % — ABNORMAL LOW (ref 36.0–46.0)
Hemoglobin: 9.7 g/dL — ABNORMAL LOW (ref 12.0–15.0)
Lymphocytes Relative: 10 % — ABNORMAL LOW (ref 12–46)
Lymphs Abs: 1.2 10*3/uL (ref 0.7–4.0)
MCH: 29.7 pg (ref 26.0–34.0)
MCHC: 35.4 g/dL (ref 30.0–36.0)
MCV: 83.8 fL (ref 78.0–100.0)
Monocytes Absolute: 0.8 10*3/uL (ref 0.1–1.0)
Monocytes Relative: 7 % (ref 3–12)
NEUTROS ABS: 9.8 10*3/uL — AB (ref 1.7–7.7)
NEUTROS PCT: 83 % — AB (ref 43–77)
Platelets: 121 10*3/uL — ABNORMAL LOW (ref 150–400)
RBC: 3.27 MIL/uL — ABNORMAL LOW (ref 3.87–5.11)
RDW: 14.2 % (ref 11.5–15.5)
WBC: 11.9 10*3/uL — ABNORMAL HIGH (ref 4.0–10.5)

## 2013-10-03 MED ORDER — PSYLLIUM 95 % PO PACK
1.0000 | PACK | Freq: Two times a day (BID) | ORAL | Status: DC
  Administered 2013-10-03 (×2): 1 via ORAL
  Filled 2013-10-03 (×5): qty 1

## 2013-10-03 MED ORDER — ACETAMINOPHEN 500 MG PO TABS
1000.0000 mg | ORAL_TABLET | Freq: Three times a day (TID) | ORAL | Status: DC
  Administered 2013-10-03 – 2013-10-09 (×14): 1000 mg via ORAL
  Filled 2013-10-03 (×18): qty 2

## 2013-10-03 MED ORDER — BUPIVACAINE 0.25 % ON-Q PUMP DUAL CATH 300 ML
300.0000 mL | INJECTION | Status: AC
  Filled 2013-10-03: qty 300

## 2013-10-03 MED ORDER — LACTATED RINGERS IV BOLUS (SEPSIS): 1000.0000 mL | Freq: Three times a day (TID) | INTRAVENOUS | Status: AC | PRN

## 2013-10-03 MED ORDER — MAGIC MOUTHWASH
15.0000 mL | Freq: Four times a day (QID) | ORAL | Status: DC | PRN
  Filled 2013-10-03: qty 15

## 2013-10-03 MED ORDER — MORPHINE SULFATE 2 MG/ML IJ SOLN: 2.0000 mg | INTRAMUSCULAR | Status: DC | PRN

## 2013-10-03 MED ORDER — PROMETHAZINE HCL 25 MG/ML IJ SOLN: 6.2500 mg | Freq: Four times a day (QID) | INTRAMUSCULAR | Status: DC | PRN

## 2013-10-03 MED ORDER — MENTHOL 3 MG MT LOZG: 1.0000 | LOZENGE | OROMUCOSAL | Status: DC | PRN

## 2013-10-03 MED ORDER — LIP MEDEX EX OINT
1.0000 "application " | TOPICAL_OINTMENT | Freq: Two times a day (BID) | CUTANEOUS | Status: DC
  Administered 2013-10-03 – 2013-10-11 (×18): 1 via TOPICAL
  Filled 2013-10-03 (×3): qty 7

## 2013-10-03 MED ORDER — OXYCODONE HCL 5 MG PO TABS
5.0000 mg | ORAL_TABLET | ORAL | Status: DC | PRN
  Administered 2013-10-07 – 2013-10-08 (×4): 5 mg via ORAL
  Administered 2013-10-09 – 2013-10-11 (×4): 10 mg via ORAL
  Filled 2013-10-03: qty 2
  Filled 2013-10-03: qty 1
  Filled 2013-10-03: qty 2
  Filled 2013-10-03 (×2): qty 1
  Filled 2013-10-03: qty 2
  Filled 2013-10-03: qty 1
  Filled 2013-10-03: qty 2

## 2013-10-03 MED ORDER — PHENOL 1.4 % MT LIQD: 2.0000 | OROMUCOSAL | Status: DC | PRN

## 2013-10-03 MED ORDER — ALUM & MAG HYDROXIDE-SIMETH 200-200-20 MG/5ML PO SUSP
30.0000 mL | Freq: Four times a day (QID) | ORAL | Status: DC | PRN
  Administered 2013-10-09: 30 mL via ORAL
  Filled 2013-10-03: qty 30

## 2013-10-03 MED ORDER — SACCHAROMYCES BOULARDII 250 MG PO CAPS
250.0000 mg | ORAL_CAPSULE | Freq: Two times a day (BID) | ORAL | Status: DC
  Administered 2013-10-03 – 2013-10-07 (×9): 250 mg via ORAL
  Filled 2013-10-03 (×12): qty 1

## 2013-10-03 NOTE — Progress Notes (Signed)
This RN wasted 1 mg PCA morphine in the sink with Brynda Greathouse Rn.

## 2013-10-03 NOTE — Progress Notes (Addendum)
[facility]  [address] [place], [place] [address] Phone: [phone] FAX: [phone]    Cloma Rahrig [id number] [birth year]  CARE TEAM:  PCP: Angelica Chessman, MD  Outpatient Care Team: Patient Care Team: Angelica Chessman, MD as PCP - General (Internal Medicine)  Inpatient Treatment Team: Treatment Team: Attending Provider: Stark Klein, MD; Registered Nurse: Richardson Landry, RN   Subjective:  Drain output going down Afraid to get up Not using PCA much Son & ICU RN in room  Objective:  Vital signs:  Filed Vitals:   10/03/13 0020 10/03/13 0400 10/03/13 0700 10/03/13 0800  BP:  118/69  121/62  Pulse:  107 94 104  Temp:  99.2 F (37.3 C)    TempSrc:  Oral    Resp: _0 Height:      Weight:  169 lb 3.2 oz (76.749 kg)    SpO2: 95% 95% 94% 93%    Last BM Date: 10/01/13  Intake/Output   Yesterday:  08/22 0701 - 08/23 0700 In: 3625.2 [I.V.:3625.2] Out: 6333 [LKTGY:5638; Drains:680] This shift:  Total I/O In: 300 [I.V.:300] Out: 535 [Urine:475; Drains:60]  Bowel function:  Flatus: y  BM: n  Drain: sanguinobilious  Physical Exam:  General: Pt awake/alert/oriented x4 in no acute distress Eyes: PERRL, normal EOM.  Sclera clear.  No icterus Neuro: CN II-XII intact w/o focal sensory/motor deficits. Lymph: No head/neck/groin lymphadenopathy Psych:  No delerium/psychosis/paranoia.  A little sleepy w morphine initially HENT: Normocephalic, Mucus membranes moist.  No thrush Neck: Supple, No tracheal deviation Chest: No chest wall pain w good excursion CV:  Pulses intact.  Regular rhythm MS: Normal AROM mjr joints.  No obvious deformity Abdomen: Soft.  Nondistended.  Mod tender at incisions only.  No evidence of peritonitis.  No incarcerated hernias. Ext:  SCDs BLE.  No mjr edema.  No cyanosis Skin: No petechiae / purpura   Problem List:   Principal Problem:   Hemangioma of liver s/p  partial hepatectomy 09/30/2013 Active Problems:   RUQ abdominal pain   Assessment  Lelan Pons  48 y.o. female  3 Days Post-Op  Procedure(s):  Intraoperative liver ultrasound, partial right hepatectomy with enucleation of hemangioma, cholecystectomy   Stabilizing  Plan:   Probable bile leak -cont JP & follow outpt - hopefully will continue to taper off.  Hold off on more aggressive intervention for now NPO except for sips from floor Pain control - renew On Q.  PCA - switch from morphine if not better - pt & RN claim it is working [employer].  Add ice/heat/tylenol Wean IVF down w PRN boluses Transfer to floor Wean oxygen - off FM to just [place] now.  Using IS more Anemia - postop expected - follow.  Should plateau.  Hold off on anticoag Post op tachycardia - control pain.  Volume PRN Anxiety - reassurance/support.  D/w pt.  Hold off on meds VTE prophylaxis- SCDs, etc Mobilize as tolerated to help recovery.  GET HER UP!!  I updated the patient's status to the patient, her son & ICU RN.  Recommendations were made.  Questions were answered.  They expressed understanding & appreciation.   Adin Hector, M.D., F.A.C.S. Gastrointestinal and Minimally Invasive Surgery [facility] [address] [place], [place] [address] [phone] Main / Paging   10/03/2013   Results:   Labs: Results for orders placed during the hospital encounter of 09/30/13 (from the past 48 hour(s))  CBC WITH  DIFFERENTIAL     Status: Abnormal   Collection Time    10/01/13 10:10 AM      Result Value Ref Range   WBC 13.9 (*) 4.0 - 10.5 K/uL   RBC 4.33  3.87 - 5.11 MIL/uL   Hemoglobin 13.0  12.0 - 15.0 g/dL   HCT 35.9 (*) 36.0 - 46.0 %   MCV 82.9  78.0 - 100.0 fL   MCH 30.0  26.0 - 34.0 pg   MCHC 36.2 (*) 30.0 - 36.0 g/dL   RDW 14.3  11.5 - 15.5 %   Platelets 116 (*) 150 - 400 K/uL   Comment: CONSISTENT WITH PREVIOUS RESULT   Neutrophils Relative % 79 (*) 43 -  77 %   Neutro Abs 11.0 (*) 1.7 - 7.7 K/uL   Lymphocytes Relative 12  12 - 46 %   Lymphs Abs 1.7  0.7 - 4.0 K/uL   Monocytes Relative 8  3 - 12 %   Monocytes Absolute 1.2 (*) 0.1 - 1.0 K/uL   Eosinophils Relative 0  0 - 5 %   Eosinophils Absolute 0.0  0.0 - 0.7 K/uL   Basophils Relative 0  0 - 1 %   Basophils Absolute 0.0  0.0 - 0.1 K/uL  CBC     Status: Abnormal   Collection Time    10/02/13  5:35 AM      Result Value Ref Range   WBC 14.9 (*) 4.0 - 10.5 K/uL   RBC 3.72 (*) 3.87 - 5.11 MIL/uL   Hemoglobin 11.1 (*) 12.0 - 15.0 g/dL   HCT 31.2 (*) 36.0 - 46.0 %   MCV 83.9  78.0 - 100.0 fL   MCH 29.8  26.0 - 34.0 pg   MCHC 35.6  30.0 - 36.0 g/dL   RDW 14.3  11.5 - 15.5 %   Platelets 121 (*) 150 - 400 K/uL  BASIC METABOLIC PANEL     Status: Abnormal   Collection Time    10/02/13  5:36 AM      Result Value Ref Range   Sodium 133 (*) 137 - 147 mEq/L   Potassium 4.6  3.7 - 5.3 mEq/L   Chloride 103  96 - 112 mEq/L   CO2 22  19 - 32 mEq/L   Glucose, Bld 162 (*) 70 - 99 mg/dL   BUN 8  6 - 23 mg/dL   Creatinine, Ser 0.54  0.50 - 1.10 mg/dL   Comment: DELTA CHECK NOTED   Calcium 7.2 (*) 8.4 - 10.5 mg/dL   GFR calc non Af Amer >90  >90 mL/min   GFR calc Af Amer >90  >90 mL/min   Comment: (NOTE)     The eGFR has been calculated using the CKD EPI equation.     This calculation has not been validated in all clinical situations.     eGFR's persistently <90 mL/min signify possible Chronic Kidney     Disease.   Anion gap 8  5 - 15  CBC WITH DIFFERENTIAL     Status: Abnormal   Collection Time    10/03/13  5:38 AM      Result Value Ref Range   WBC 11.9 (*) 4.0 - 10.5 K/uL   RBC 3.27 (*) 3.87 - 5.11 MIL/uL   Hemoglobin 9.7 (*) 12.0 - 15.0 g/dL   HCT 27.4 (*) 36.0 - 46.0 %   MCV 83.8  78.0 - 100.0 fL   MCH 29.7  26.0 - 34.0 pg  MCHC 35.4  30.0 - 36.0 g/dL   RDW 14.2  11.5 - 15.5 %   Platelets 121 (*) 150 - 400 K/uL   Neutrophils Relative % 83 (*) 43 - 77 %   Neutro Abs 9.8 (*) 1.7  - 7.7 K/uL   Lymphocytes Relative 10 (*) 12 - 46 %   Lymphs Abs 1.2  0.7 - 4.0 K/uL   Monocytes Relative 7  3 - 12 %   Monocytes Absolute 0.8  0.1 - 1.0 K/uL   Eosinophils Relative 0  0 - 5 %   Eosinophils Absolute 0.0  0.0 - 0.7 K/uL   Basophils Relative 0  0 - 1 %   Basophils Absolute 0.0  0.0 - 0.1 K/uL  BASIC METABOLIC PANEL     Status: Abnormal   Collection Time    10/03/13  5:38 AM      Result Value Ref Range   Sodium 136 (*) 137 - 147 mEq/L   Potassium 4.0  3.7 - 5.3 mEq/L   Chloride 104  96 - 112 mEq/L   CO2 25  19 - 32 mEq/L   Glucose, Bld 150 (*) 70 - 99 mg/dL   BUN 5 (*) 6 - 23 mg/dL   Creatinine, Ser 0.56  0.50 - 1.10 mg/dL   Calcium 7.5 (*) 8.4 - 10.5 mg/dL   GFR calc non Af Amer >90  >90 mL/min   GFR calc Af Amer >90  >90 mL/min   Comment: (NOTE)     The eGFR has been calculated using the CKD EPI equation.     This calculation has not been validated in all clinical situations.     eGFR's persistently <90 mL/min signify possible Chronic Kidney     Disease.   Anion gap 7  5 - 15    Imaging / Studies: Dg Chest Port 1 View  10/01/2013   CLINICAL DATA:  Shortness of breath  EXAM: PORTABLE CHEST - 1 VIEW  COMPARISON:  PA and lateral chest of September 27, 2013  FINDINGS: There is new increased density in volume loss on the right. The hemidiaphragm is obscured. On the left the lung is less well inflated today and there is minimal subsegmental atelectasis at the lung base. The cardiac silhouette is not enlarged. The pulmonary vascularity is not engorged.  IMPRESSION: Volume loss on the right with increased basilar density is consistent with atelectasis or pneumonia with pleural fluid. There is no definite evidence of CHF. This reflects a marked deterioration since the study 4 days ago. Marked change over the preceding 4 days.   Electronically Signed   By: David  [place]   On: 10/01/2013 15:37    Medications / Allergies: per chart  Antibiotics: Anti-infectives   Start      Dose/Rate Route Frequency Ordered Stop   09/30/13 1400  ceFAZolin (ANCEF) IVPB 1 g/50 mL premix     1 g 100 mL/hr over 30 Minutes Intravenous Every 6 hours 09/30/13 1315 10/01/13 0212   09/30/13 0608  ceFAZolin (ANCEF) IVPB 2 g/50 mL premix     2 g 100 mL/hr over 30 Minutes Intravenous On call to O.R. 09/30/13 6754 09/30/13 0755       Note: Portions of this report may have been transcribed using voice recognition software. Every effort was made to ensure accuracy; however, inadvertent computerized transcription errors may be present.   Any transcriptional errors that result from this process are unintentional.

## 2013-10-04 LAB — TYPE AND SCREEN
ABO/RH(D): A POS
ANTIBODY SCREEN: NEGATIVE
UNIT DIVISION: 0
UNIT DIVISION: 0
Unit division: 0
Unit division: 0
Unit division: 0
Unit division: 0

## 2013-10-04 LAB — CBC
HCT: 27.4 % — ABNORMAL LOW (ref 36.0–46.0)
Hemoglobin: 9.5 g/dL — ABNORMAL LOW (ref 12.0–15.0)
MCH: 30.1 pg (ref 26.0–34.0)
MCHC: 34.7 g/dL (ref 30.0–36.0)
MCV: 86.7 fL (ref 78.0–100.0)
Platelets: 195 10*3/uL (ref 150–400)
RBC: 3.16 MIL/uL — AB (ref 3.87–5.11)
RDW: 14.4 % (ref 11.5–15.5)
WBC: 13.6 10*3/uL — AB (ref 4.0–10.5)

## 2013-10-04 LAB — COMPREHENSIVE METABOLIC PANEL
ALT: 256 U/L — AB (ref 0–35)
AST: 63 U/L — AB (ref 0–37)
Albumin: 1.9 g/dL — ABNORMAL LOW (ref 3.5–5.2)
Alkaline Phosphatase: 49 U/L (ref 39–117)
Anion gap: 8 (ref 5–15)
BILIRUBIN TOTAL: 2.1 mg/dL — AB (ref 0.3–1.2)
BUN: 9 mg/dL (ref 6–23)
CO2: 25 meq/L (ref 19–32)
Calcium: 7.5 mg/dL — ABNORMAL LOW (ref 8.4–10.5)
Chloride: 103 mEq/L (ref 96–112)
Creatinine, Ser: 0.49 mg/dL — ABNORMAL LOW (ref 0.50–1.10)
GFR calc Af Amer: >90 mL/min (ref >90)
Glucose, Bld: 124 mg/dL — ABNORMAL HIGH (ref 70–99)
Potassium: 3.8 mEq/L (ref 3.7–5.3)
SODIUM: 136 meq/L — AB (ref 137–147)
Total Protein: 4.8 g/dL — ABNORMAL LOW (ref 6.0–8.3)

## 2013-10-04 MED ORDER — PIPERACILLIN-TAZOBACTAM 3.375 G IVPB
3.3750 g | Freq: Three times a day (TID) | INTRAVENOUS | Status: DC
  Administered 2013-10-04 – 2013-10-12 (×24): 3.375 g via INTRAVENOUS
  Filled 2013-10-04 (×28): qty 50

## 2013-10-04 MED ORDER — BISACODYL 10 MG RE SUPP
10.0000 mg | Freq: Every day | RECTAL | Status: DC
  Administered 2013-10-04 – 2013-10-10 (×4): 10 mg via RECTAL
  Filled 2013-10-04 (×7): qty 1

## 2013-10-04 NOTE — Progress Notes (Signed)
During a.m. Assessment (during which an interpreter was used), RN had advised pt/son to advise staff any time he had questions/concerns and an interpreter would be made available.  Pt's son came to RN in hallway verbalizing concerns about his mother's tiredness.  Interpreter translated his comments that mother/pt had seemed to have much more energy when he left earlier in the morning and now she seemed very tired and sleepy.  Pt had been up in chair since late morning and son confirmed that she had been walking to BR with assist and had walked in hall with PT.  RN asked pt if she felt that anything was different (symptom-wise) from a.m. Assessment.  She replied that she "was tired" to interpreter.  VS taken. BP increased from earlier assessment but not within parameters to contact MD. Pt's forehead mildly diaphoretic (she had been resting under several blankets).  Pt asked to use [facility] which was done and she was returned to bed.  No additional diaphoresis noted.  Discussed with pt/son results of assessment and impact of ambulating to BR and with PT in causing pt to fatigue.  Also addressed any other pt/family questions.  Charge nurse, [place], entered room during assessment and concurred with RN's findings relative to no new concerns and reinforced this to pt's son.  Son said he had no additional questions and interpreter session was ended.  Will continue to monitor.

## 2013-10-04 NOTE — Progress Notes (Signed)
Vital signs stable. Pt resting in bed, reporting pain controlled with PCA/pain pump.  Several family members visiting.  Pt's brother asking questions about pt's status.  Pt's son present and conversing with brother ref earlier discussion.    Brother declined need for interpreter for this discussion, but was informed that interpreter available 24 hr/day any time he desired.  Advised brother/son that MD was advised of pt's frequent JP drain output and MD response (per nurse [facility]) that OP not considered unusual or needing RN action at this point.  Brother said pt had coughed and he wanted to know her medicines, which were explained.  Advised brother (as done with son earlier) of importance of pt getting OOB for toileting whenever possible to increase activity. Also advised him (and son) of pt's tendency toward shallow breathing; emphasized need for deep breathing followed by cough to clear lungs.  Reviewed importance of hourly use of incentive spirometer which family had placed behind chairs out of sight.  Now giving report to night nurse including brother/son's concerns and need for increased mobility, deep breathing, IS use.  Night nurse will continue to monitor.

## 2013-10-04 NOTE — Progress Notes (Signed)
Notified MD on call about high JP output. No new orders obtained. Will continue to monitor.

## 2013-10-04 NOTE — Progress Notes (Signed)
Patient ID: Deborah Trujillo, female   DOB: 1965/12/30, 48 y.o.   MRN: [id number] 4 Days Post-Op  Subjective: Pt less sore, but does have some burning pain at night.    Objective: Vital signs in last 24 hours: Temp:  [98.1 F (36.7 C)-98.7 F (37.1 C)] 98.1 F (36.7 C) (08/24 0510) Pulse Rate:  [78-125] 88 (08/24 0510) Resp:  [20-25] 22 (08/24 0510) BP: (103-130)/(71-82) 107/71 mmHg (08/24 0510) SpO2:  [93 %-98 %] 93 % (08/24 0510) Last BM Date:  (PTA)  Intake/Output from previous day: 08/23 0701 - 08/24 0700 In: 1928 [P.O.:360; I.V.:1568] Out: 2325 [Urine:1970; Drains:355] Intake/Output this shift:    General appearance: alert, cooperative and mild distress Resp: breathing comfortably GI: soft, approp tender Extremities: no edema JP with bile in drain.    Lab Results:   Recent Labs  10/03/13 0538 10/04/13 0500  WBC 11.9* 13.6*  HGB 9.7* 9.5*  HCT 27.4* 27.4*  PLT 121* 195   BMET  Recent Labs  10/03/13 0538 10/04/13 0500  NA 136* 136*  K 4.0 3.8  CL 104 103  CO2 25 25  GLUCOSE 150* 124*  BUN 5* 9  CREATININE 0.56 0.49*  CALCIUM 7.5* 7.5*   PT/INR No results found for this basename: LABPROT, INR,  in the last 72 hours ABG No results found for this basename: PHART, PCO2, PO2, HCO3,  in the last 72 hours  Studies/Results: No results found.  Anti-infectives: Anti-infectives   Start     Dose/Rate Route Frequency Ordered Stop   10/04/13 0915  piperacillin-tazobactam (ZOSYN) IVPB 3.375 g     3.375 g 12.5 mL/hr over 240 Minutes Intravenous 3 times per day 10/04/13 0900     09/30/13 1400  ceFAZolin (ANCEF) IVPB 1 g/50 mL premix     1 g 100 mL/hr over 30 Minutes Intravenous Every 6 hours 09/30/13 1315 10/01/13 0212   09/30/13 0608  ceFAZolin (ANCEF) IVPB 2 g/50 mL premix     2 g 100 mL/hr over 30 Minutes Intravenous On call to O.R. 09/30/13 6578 09/30/13 0755      Assessment/Plan: s/p Procedure(s): PARTIAL RIGHT HEPATECTOMY; CHOLECYSTECTOMY  (Right) Foley out today. Antibiotics for bile leak. Recheck CBC later today Metoprolol IS OOB today. Suppository ambulate  LOS: 4 days    Deborah Trujillo 10/04/2013

## 2013-10-04 NOTE — Progress Notes (Signed)
Physical Therapy Treatment Patient Details Name: Deborah Trujillo MRN: [id number] DOB: April 10, 1965 Today's Date: 10/04/2013    History of Present Illness 48 yo female admitted 09/30/13 with R flank pain. s/p partial right hepatectomy with enucleation of hemangioma, cholecystectomy - 09/30/2013     PT Comments    Patient ambulated  X 60' today. Continue to encourage ambulation.  Follow Up Recommendations  No PT follow up     Equipment Recommendations  None recommended by PT    Recommendations for Other Services       Precautions / Restrictions Precautions Precaution Comments: multiple drains, lines,     Mobility  Bed Mobility                  Transfers Overall transfer level: Needs assistance Equipment used: Rolling walker (2 wheeled);2 person hand held assist Transfers: Sit to/from Stand Sit to Stand: +2 physical assistance         General transfer comment: lifting support from recliner  Ambulation/Gait Ambulation/Gait assistance: +2 safety/equipment;Min assist Ambulation Distance (Feet): 60 Feet Assistive device: Rolling walker (2 wheeled) Gait Pattern/deviations: Step-through pattern;Decreased stride length Gait velocity: slow   General Gait Details:  moves very guardedly, reports dizzy via son interpreting but did not impede ambulation   Stairs            Wheelchair Mobility    Modified Rankin (Stroke Patients Only)       Balance                                    Cognition Arousal/Alertness: Awake/alert                          Exercises      General Comments        Pertinent Vitals/Pain Pain Score: 4  Pain Location: R upper quadrant Pain Descriptors / Indicators: Aching;Cramping Pain Intervention(s): Monitored during session;Ice applied;PCA encouraged    Home Living                      Prior Function            PT Goals (current goals can now be found in the care plan section)  Progress towards PT goals: Progressing toward goals    Frequency  Min 3X/week    PT Plan Current plan remains appropriate    Co-evaluation             End of Session   Activity Tolerance: Patient tolerated treatment well Patient left: in chair;with call bell/phone within reach;with family/visitor present     Time: 9030-0923 PT Time Calculation (min): 23 min  Charges:  $Gait Training: 23-37 mins                    G Codes:      [name] 10/04/2013, 11:58 AM

## 2013-10-05 LAB — CBC
HCT: 27.4 % — ABNORMAL LOW (ref 36.0–46.0)
Hemoglobin: 9.6 g/dL — ABNORMAL LOW (ref 12.0–15.0)
MCH: 30.1 pg (ref 26.0–34.0)
MCHC: 35 g/dL (ref 30.0–36.0)
MCV: 85.9 fL (ref 78.0–100.0)
Platelets: 258 10*3/uL (ref 150–400)
RBC: 3.19 MIL/uL — AB (ref 3.87–5.11)
RDW: 14.6 % (ref 11.5–15.5)
WBC: 14.8 10*3/uL — ABNORMAL HIGH (ref 4.0–10.5)

## 2013-10-05 NOTE — Progress Notes (Signed)
Patient ID: Deborah Trujillo, female   DOB: 03-16-1965, 48 y.o.   MRN: [id number] 5 Days Post-Op  Subjective: Pt had BM with suppository, but still feeling a bit nauseated and having sweats.  JP volume increased dramatically  Objective: Vital signs in last 24 hours: Temp:  [97.8 F (36.6 C)-98.8 F (37.1 C)] 98.8 F (37.1 C) (08/25 1412) Pulse Rate:  [67-99] 88 (08/25 1412) Resp:  [13-24] 18 (08/25 1412) BP: (113-133)/(70-89) 133/84 mmHg (08/25 1412) SpO2:  [90 %-96 %] 95 % (08/25 1412) Last BM Date: 10/01/13  Intake/Output from previous day: 08/24 0701 - 08/25 0700 In: 2670 [P.O.:220; I.V.:2400; IV Piggyback:50] Out: 2075 [Urine:850; Drains:1225] Intake/Output this shift: Total I/O In: 1022.7 [P.O.:120; I.V.:875.3; IV Piggyback:27.4] Out: 1015 [Urine:400; Drains:615]  General appearance: alert, cooperative and mild distress Resp: breathing comfortably GI: soft, approp tender Extremities: no edema JP with bile in drain.    Lab Results:   Recent Labs  10/04/13 0500 10/05/13 0635  WBC 13.6* 14.8*  HGB 9.5* 9.6*  HCT 27.4* 27.4*  PLT 195 258   BMET  Recent Labs  10/03/13 0538 10/04/13 0500  NA 136* 136*  K 4.0 3.8  CL 104 103  CO2 25 25  GLUCOSE 150* 124*  BUN 5* 9  CREATININE 0.56 0.49*  CALCIUM 7.5* 7.5*   PT/INR No results found for this basename: LABPROT, INR,  in the last 72 hours ABG No results found for this basename: PHART, PCO2, PO2, HCO3,  in the last 72 hours  Studies/Results: No results found.  Anti-infectives: Anti-infectives   Start     Dose/Rate Route Frequency Ordered Stop   10/04/13 1000  piperacillin-tazobactam (ZOSYN) IVPB 3.375 g     3.375 g 12.5 mL/hr over 240 Minutes Intravenous Every 8 hours 10/04/13 0900     09/30/13 1400  ceFAZolin (ANCEF) IVPB 1 g/50 mL premix     1 g 100 mL/hr over 30 Minutes Intravenous Every 6 hours 09/30/13 1315 10/01/13 0212   09/30/13 0608  ceFAZolin (ANCEF) IVPB 2 g/50 mL premix     2 g 100  mL/hr over 30 Minutes Intravenous On call to O.R. 09/30/13 [id number] 09/30/13 0755      Assessment/Plan: s/p Procedure(s): PARTIAL RIGHT HEPATECTOMY; CHOLECYSTECTOMY (Right)  Antibiotics for bile leak. Consult GI for ERCP/stent to divert bile flow.   IS OOB today.  ambulate  LOS: 5 days    [facility] 10/05/2013

## 2013-10-05 NOTE — Progress Notes (Signed)
Physical Therapy Treatment Patient Details Name: Deborah Trujillo MRN: [id number] DOB: Dec 04, 1965 Today's Date: 10/05/2013    History of Present Illness 48 yo female admitted 09/30/13 with R flank pain. s/p partial right hepatectomy with enucleation of hemangioma, cholecystectomy - 09/30/2013     PT Comments    Assisted pt OOB to [facility]  Assisted with hygiene.  Assisted with amb in hallway.  Student nurse assited with interpretation as pt is Spanish speaking.  Mild c/o dizziness.  Tolerated amb in hallway then positioned in recliner.    Follow Up Recommendations  No PT follow up     Equipment Recommendations       Recommendations for Other Services       Precautions / Restrictions Precautions Precaution Comments: multiple drains, lines,  Restrictions Weight Bearing Restrictions: No    Mobility  Bed Mobility Overal bed mobility: Needs Assistance Bed Mobility: Rolling;Sidelying to Sit Rolling: Min assist Sidelying to sit: Min assist       General bed mobility comments: cues for technique and for abdominal protection plus increased time  Transfers Overall transfer level: Needs assistance Equipment used: Rolling walker (2 wheeled);2 person hand held assist Transfers: Sit to/from Stand Sit to Stand: +2 physical assistance Stand pivot transfers: +2 physical assistance;+2 safety/equipment;Min assist       General transfer comment: cueing on hand placement and increased time  Ambulation/Gait Ambulation/Gait assistance: +2 safety/equipment;Min assist Ambulation Distance (Feet): 45 Feet Assistive device: Rolling walker (2 wheeled) Gait Pattern/deviations: Step-through pattern;Decreased stride length Gait velocity: slow   General Gait Details: slow but steady gait with mild c/o dizziness.     Stairs            Wheelchair Mobility    Modified Rankin (Stroke Patients Only)       Balance                                    Cognition                             Exercises      General Comments        Pertinent Vitals/Pain      Home Living                      Prior Function            PT Goals (current goals can now be found in the care plan section) Progress towards PT goals: Progressing toward goals    Frequency  Min 3X/week    PT Plan      Co-evaluation             End of Session Equipment Utilized During Treatment: Gait belt Activity Tolerance: Patient tolerated treatment well Patient left: in chair;with call bell/phone within reach;with family/visitor present     Time: 3785-8850 PT Time Calculation (min): 24 min  Charges:  $Gait Training: 8-22 mins $Therapeutic Activity: 8-22 mins                    G Codes:      Rica Koyanagi  PTA [facility]  Acute  Rehab Pager      [phone]

## 2013-10-05 NOTE — Consult Note (Signed)
[facility] CONSULT Reason for consult: possible bile leak Referring Physician: Dr Byerly. Primary G.I.: Dr. Outlaw  Deborah Trujillo is an 48 y.o. female.  HPI: 48-year-old Hispanic female who does not speak English. She was evaluated by Dr. Outlaw recently for abdominal pain. She was found to have a hepatic mass and underwent partial right hepatectomy and cholecystectomy 8/20. 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 The patient had been doing reasonably well but over the past 2 days has had marked increase in output from her [place]ane. We're asked to see her regarding possible bile leak and whether ERCP would be needed. Currently, there is no one in her room who speaks English so I'm unable to really get much history about her symptoms at this time.  Past Medical History  Diagnosis Date  . Liver mass   . Hyperlipidemia   . Pain     PAIN - BURNING SENSATION RIGHT SIDE - BLOOD WORK ABNORMAL - FOUND TO HAVE LIVER MASS  . Colitis     Past Surgical History  Procedure Laterality Date  . Neck mass excision  2009    absess removed   . Tubal ligation    . Laparoscopic partial hepatectomy Right 09/30/2013    Procedure: PARTIAL RIGHT HEPATECTOMY; CHOLECYSTECTOMY;  Surgeon: Faera Byerly, MD;  Location: [facility];  Service: General;  Laterality: Right;    Family History  Problem Relation Age of Onset  . Diabetes Mother     Social History:  reports that she has never smoked. She has never used smokeless tobacco. She reports that she does not drink alcohol or use illicit drugs.  Allergies: No Known Allergies  Medications; Prior to Admission medications   Not on File   . acetaminophen  1,000 mg Oral TID  . bisacodyl  10 mg Rectal Daily  . lactated ringers  1,000 mL Intravenous Once  . lip balm  1 application Topical BID  . metoprolol  5 mg Intravenous 4 times per day  . morphine   Intravenous 6 times per day  .  piperacillin-tazobactam (ZOSYN)  IV  3.375 g Intravenous Q8H  . saccharomyces boulardii  250 mg Oral BID   PRN Meds alum & mag hydroxide-simeth, diphenhydrAMINE, diphenhydrAMINE, magic mouthwash, menthol-cetylpyridinium, morphine injection, naloxone, ondansetron (ZOFRAN) IV, ondansetron, oxyCODONE, phenol, promethazine, sodium chloride Results for orders placed during the hospital encounter of 09/30/13 (from the past 48 hour(s))  CBC     Status: Abnormal   Collection Time    10/04/13  5:00 AM      Result Value Ref Range   WBC 13.6 (*) 4.0 - 10.5 K/uL   RBC 3.16 (*) 3.87 - 5.11 MIL/uL   Hemoglobin 9.5 (*) 12.0 - 15.0 g/dL   HCT 27.4 (*) 36.0 - 46.0 %   MCV 86.7  78.0 - 100.0 fL   MCH 30.1  26.0 - 34.0 pg   MCHC 34.7  30.0 - 36.0 g/dL   RDW 14.4  11.5 - 15.5 %   Platelets 195  150 - 400 K/uL   Comment: DELTA CHECK NOTED     REPEATED TO VERIFY  COMPREHENSIVE METABOLIC PANEL     Status: Abnormal   Collection Time    10/04/13  5:00 AM      Result Value Ref Range   Sodium 136 (*) 137 - 147 mEq/L   Potassium 3.8  3.7 - 5.3 mEq/L   Chloride 103  96 - 112 mEq/L     CO2 25  19 - 32 mEq/L   Glucose, Bld 124 (*) 70 - 99 mg/dL   BUN 9  6 - 23 mg/dL   Creatinine, Ser 0.49 (*) 0.50 - 1.10 mg/dL   Calcium 7.5 (*) 8.4 - 10.5 mg/dL   Total Protein 4.8 (*) 6.0 - 8.3 g/dL   Albumin 1.9 (*) 3.5 - 5.2 g/dL   AST 63 (*) 0 - 37 U/L   ALT 256 (*) 0 - 35 U/L   Alkaline Phosphatase 49  39 - 117 U/L   Total Bilirubin 2.1 (*) 0.3 - 1.2 mg/dL   GFR calc non Af Amer >90  >90 mL/min   GFR calc Af Amer >90  >90 mL/min   Comment: (NOTE)     The eGFR has been calculated using the CKD EPI equation.     This calculation has not been validated in all clinical situations.     eGFR's persistently <90 mL/min signify possible Chronic Kidney     Disease.   Anion gap 8  5 - 15  CBC     Status: Abnormal   Collection Time    10/05/13  6:35 AM      Result Value Ref Range   WBC 14.8 (*) 4.0 - 10.5 K/uL   RBC 3.19  (*) 3.87 - 5.11 MIL/uL   Hemoglobin 9.6 (*) 12.0 - 15.0 g/dL   HCT 27.4 (*) 36.0 - 46.0 %   MCV 85.9  78.0 - 100.0 fL   MCH 30.1  26.0 - 34.0 pg   MCHC 35.0  30.0 - 36.0 g/dL   RDW 14.6  11.5 - 15.5 %   Platelets 258  150 - 400 K/uL   Comment: REPEATED TO VERIFY     DELTA CHECK NOTED    No results found.             Blood pressure 133/84, pulse 88, temperature 98.8 F (37.1 C), temperature source Oral, resp. rate 18, height 4' 9.5" (1.461 m), weight 76.749 kg (169 lb 3.2 oz), SpO2 95.00%.  Physical exam:   General-- pleasant Hispanic female no severe distress  Heart-- regular rate and rhythm without murmurs are gallops Lungs--clear Abdomen-- somewhat quiet with appropriate tenderness. There's only a small amount of bile in the [place] drained currently.   Assessment: 1. Status post resection of hepatic hemangioma and cholecystectomy. There is increase in output from the [place] drain. Download this point whether the patient needs some other procedure to rule out bile leak such as hepatobiliary scan or CT. May need to do ERCP.  Plan: will discuss with Dr. Outlaw and see if he can discuss with Dr Byerly tomorrow. Did not have an interpreter comment this point since not really sure what will tell the patient at this time.   Keyshia Orwick JR,Lakita Sahlin L 10/05/2013, 5:53 PM      

## 2013-10-06 ENCOUNTER — Encounter (HOSPITAL_COMMUNITY): Payer: Self-pay | Admitting: Anesthesiology

## 2013-10-06 ENCOUNTER — Encounter (HOSPITAL_COMMUNITY): Payer: Self-pay | Admitting: *Deleted

## 2013-10-06 ENCOUNTER — Encounter (HOSPITAL_COMMUNITY)
Admission: RE | Disposition: A | Discharge status: Home / Self Care | Payer: Self-pay | Source: Ambulatory Visit | Attending: General Surgery

## 2013-10-06 ENCOUNTER — Inpatient Hospital Stay (HOSPITAL_COMMUNITY): Payer: Self-pay | Admitting: Anesthesiology

## 2013-10-06 ENCOUNTER — Inpatient Hospital Stay (HOSPITAL_COMMUNITY): Payer: Self-pay

## 2013-10-06 HISTORY — PX: ERCP: SHX5425

## 2013-10-06 LAB — CBC
HEMATOCRIT: 27.9 % — AB (ref 36.0–46.0)
Hemoglobin: 9.6 g/dL — ABNORMAL LOW (ref 12.0–15.0)
MCH: 29.8 pg (ref 26.0–34.0)
MCHC: 34.4 g/dL (ref 30.0–36.0)
MCV: 86.6 fL (ref 78.0–100.0)
Platelets: UNDETERMINED 10*3/uL (ref 150–400)
RBC: 3.22 MIL/uL — ABNORMAL LOW (ref 3.87–5.11)
RDW: 15 % (ref 11.5–15.5)
WBC: 14.2 10*3/uL — ABNORMAL HIGH (ref 4.0–10.5)

## 2013-10-06 LAB — EXPECTORATED SPUTUM ASSESSMENT W REFEX TO RESP CULTURE: Special Requests: NORMAL

## 2013-10-06 SURGERY — ERCP, WITH INTERVENTION IF INDICATED
Anesthesia: Monitor Anesthesia Care

## 2013-10-06 MED ORDER — SODIUM CHLORIDE 0.9 % IV SOLN
INTRAVENOUS | Status: DC | PRN
  Administered 2013-10-06: 15:00:00

## 2013-10-06 MED ORDER — ROCURONIUM BROMIDE 100 MG/10ML IV SOLN
INTRAVENOUS | Status: DC | PRN
  Administered 2013-10-06: 5 mg via INTRAVENOUS

## 2013-10-06 MED ORDER — LIDOCAINE HCL (CARDIAC) 20 MG/ML IV SOLN
INTRAVENOUS | Status: DC | PRN
  Administered 2013-10-06: 50 mg via INTRAVENOUS

## 2013-10-06 MED ORDER — FENTANYL CITRATE 0.05 MG/ML IJ SOLN
INTRAMUSCULAR | Status: DC | PRN
  Administered 2013-10-06: 25 ug via INTRAVENOUS

## 2013-10-06 MED ORDER — SODIUM CHLORIDE 0.9 % IV SOLN
1000.0000 ug | INTRAVENOUS | Status: DC | PRN
  Administered 2013-10-06: .25 ug/kg/min via INTRAVENOUS

## 2013-10-06 MED ORDER — VANCOMYCIN HCL 10 G IV SOLR
1500.0000 mg | Freq: Once | INTRAVENOUS | Status: AC
  Administered 2013-10-06: 1500 mg via INTRAVENOUS
  Filled 2013-10-06 (×2): qty 1500

## 2013-10-06 MED ORDER — SUCCINYLCHOLINE CHLORIDE 20 MG/ML IJ SOLN
INTRAMUSCULAR | Status: DC | PRN
  Administered 2013-10-06: 100 mg via INTRAVENOUS

## 2013-10-06 MED ORDER — PROPOFOL 10 MG/ML IV BOLUS
INTRAVENOUS | Status: DC | PRN
  Administered 2013-10-06: 150 mg via INTRAVENOUS

## 2013-10-06 MED ORDER — FENTANYL CITRATE 0.05 MG/ML IJ SOLN
INTRAMUSCULAR | Status: AC
  Filled 2013-10-06: qty 2

## 2013-10-06 MED ORDER — REMIFENTANIL HCL 1 MG IV SOLR
INTRAVENOUS | Status: AC
  Filled 2013-10-06: qty 1000

## 2013-10-06 MED ORDER — VANCOMYCIN HCL IN DEXTROSE 1-5 GM/200ML-% IV SOLN
1000.0000 mg | Freq: Two times a day (BID) | INTRAVENOUS | Status: DC
  Administered 2013-10-07 – 2013-10-08 (×3): 1000 mg via INTRAVENOUS
  Filled 2013-10-06 (×7): qty 200

## 2013-10-06 MED ORDER — GLYCOPYRROLATE 0.2 MG/ML IJ SOLN
INTRAMUSCULAR | Status: DC | PRN
  Administered 2013-10-06: 0.2 mg via INTRAVENOUS

## 2013-10-06 MED ORDER — PROPOFOL 10 MG/ML IV BOLUS
INTRAVENOUS | Status: AC
  Filled 2013-10-06: qty 20

## 2013-10-06 MED ORDER — ONDANSETRON HCL 4 MG/2ML IJ SOLN
INTRAMUSCULAR | Status: AC
  Filled 2013-10-06: qty 2

## 2013-10-06 MED ORDER — LACTATED RINGERS IV SOLN
INTRAVENOUS | Status: DC | PRN
  Administered 2013-10-06: 13:00:00 via INTRAVENOUS

## 2013-10-06 MED ORDER — GLUCAGON HCL RDNA (DIAGNOSTIC) 1 MG IJ SOLR
INTRAMUSCULAR | Status: DC | PRN
  Administered 2013-10-06: .5 mg via INTRAVENOUS

## 2013-10-06 NOTE — Transfer of Care (Addendum)
Immediate Anesthesia Transfer of Care Note  Patient: Deborah Trujillo  Procedure(s) Performed: Procedure(s): ENDOSCOPIC RETROGRADE CHOLANGIOPANCREATOGRAPHY (ERCP) (N/A)  Patient Location: PACU  Anesthesia Type:General  Level of Consciousness: awake, alert , oriented and patient cooperative  Airway & Oxygen Therapy: Patient Spontanous Breathing and Patient connected to face mask oxygen  Post-op Assessment: Report given to PACU RN, Post -op Vital signs reviewed and stable and Patient moving all extremities X 4  Post vital signs: stable  Complications: No apparent anesthesia complications  O2 sats down with nasal cannula 89%.  Dr. Barry Dienes called about  Poor pulmonary effor and  Copious tan s ecretion when ETT  Puilled. Sputum sent from  ETT for Culture and sensitivity. O2 changed to  Face mask with O2 sats 94-95%. Pt to be transferred to stepdown  For more vigourous pulmonary  Toilet..Poor lung status preop. To get chest Xray in PACU.

## 2013-10-06 NOTE — Progress Notes (Signed)
Patient ID: Deborah Trujillo, female   DOB: [birth year], 48 y.o.   MRN: [id number] Day of Surgery   Subjective: Pt continues to feel weak.  Drain still leaking.  Pt with ERCP and bile leak after bifurcation.    Objective: Vital signs in last 24 hours: Temp:  [97.8 F (36.6 C)-98.7 F (37.1 C)] 98.2 F (36.8 C) (08/26 0935) Pulse Rate:  [62-91] 91 (08/26 1435) Resp:  [14-25] 20 (08/26 1435) BP: (101-149)/(65-78) 149/69 mmHg (08/26 1435) SpO2:  [90 %-97 %] 90 % (08/26 1441) Last BM Date: 10/05/13  Intake/Output from previous day: 08/25 0701 - 08/26 0700 In: 2561.4 [P.O.:120; I.V.:2414; IV Piggyback:27.4] Out: 2530 [Urine:1100; Drains:1430] Intake/Output this shift: Total I/O In: 0  Out: 777 [Urine:350; Drains:427]  General appearance: alert, cooperative and mild distress Resp: breathing comfortably GI: soft, approp tender Extremities: no edema JP with bile in drain.    Lab Results:   Recent Labs  10/05/13 0635 10/06/13 0505  WBC 14.8* 14.2*  HGB 9.6* 9.6*  HCT 27.4* 27.9*  PLT 258 PLATELET CLUMPS NOTED ON SMEAR, UNABLE TO ESTIMATE   BMET  Recent Labs  10/04/13 0500  NA 136*  K 3.8  CL 103  CO2 25  GLUCOSE 124*  BUN 9  CREATININE 0.49*  CALCIUM 7.5*   PT/INR No results found for this basename: LABPROT, INR,  in the last 72 hours ABG No results found for this basename: PHART, PCO2, PO2, HCO3,  in the last 72 hours  Studies/Results: No results found.  Anti-infectives: Anti-infectives   Start     Dose/Rate Route Frequency Ordered Stop   10/04/13 1000  piperacillin-tazobactam (ZOSYN) IVPB 3.375 g     3.375 g 12.5 mL/hr over 240 Minutes Intravenous Every 8 hours 10/04/13 0900     09/30/13 1400  ceFAZolin (ANCEF) IVPB 1 g/50 mL premix     1 g 100 mL/hr over 30 Minutes Intravenous Every 6 hours 09/30/13 1315 10/01/13 0212   09/30/13 0608  ceFAZolin (ANCEF) IVPB 2 g/50 mL premix     2 g 100 mL/hr over 30 Minutes Intravenous On call to O.R. 09/30/13  0211 09/30/13 0755      Assessment/Plan: s/p Procedure(s): ENDOSCOPIC RETROGRADE CHOLANGIOPANCREATOGRAPHY (ERCP) (N/A) Stent, antibiotics CXR Add vanc Transfer to stepdown. IS OOB today.  ambulate  LOS: 6 days    [facility] 10/06/2013

## 2013-10-06 NOTE — Anesthesia Postprocedure Evaluation (Signed)
Anesthesia Post Note  Patient: Deborah Trujillo  Procedure(s) Performed: Procedure(s) (LRB): ENDOSCOPIC RETROGRADE CHOLANGIOPANCREATOGRAPHY (ERCP) (N/A)  Anesthesia type: General  Patient location: PACU  Post pain: Pain level controlled  Post assessment: Post-op Vital signs reviewed  Last Vitals: BP 121/67  Pulse 85  Temp(Src) 36.8 C (Oral)  Resp 24  Ht 4' 9.5" (1.461 m)  Wt 169 lb 3.2 oz (76.749 kg)  BMI 35.96 kg/m2  SpO2 94%  Post vital signs: Reviewed  Level of consciousness: sedated  Complications: No apparent anesthesia complications

## 2013-10-06 NOTE — Interval H&P Note (Signed)
History and Physical Interval Note:  10/06/2013 1:15 PM  Deborah Trujillo  has presented today for surgery, with the diagnosis of Bile duct leak  The various methods of treatment have been discussed with the patient and family. After consideration of risks, benefits and other options for treatment, the patient has consented to  Procedure(s): ENDOSCOPIC RETROGRADE CHOLANGIOPANCREATOGRAPHY (ERCP) (N/A) as a surgical intervention .  The patient's history has been reviewed, patient examined, no change in status, stable for surgery.  I have reviewed the patient's chart and labs.  Questions were answered to the patient's satisfaction.     David Towson M  Assessment:  1.  Bile leak, after cholecystectomy and removal of right hepatic lobe hemangioma.  Suspect bile leak is due to liver lesion removal.  Plan:  1.  ERCP with hopeful biliary stent placement. 2.  Risks (up to and including bleeding, infection, perforation, pancreatitis that can be complicated by infected necrosis and death), benefits (removal of stones, alleviating blockage, decreasing risk of cholangitis or choledocholithiasis-related pancreatitis), and alternatives (watchful waiting, percutaneous transhepatic cholangiography) of ERCP were explained to patient/family in detail and patient elects to proceed.  Language Resources interpreter used for translation.

## 2013-10-06 NOTE — Progress Notes (Addendum)
Wasted Morphine PCA 60ml in sink with Dannielle Huh, RN

## 2013-10-06 NOTE — Anesthesia Preprocedure Evaluation (Addendum)
Anesthesia Evaluation  Patient identified by MRN, date of birth, ID band Patient awake    Reviewed: Allergy & Precautions, H&P , NPO status , Patient's Chart, lab work & pertinent test results  Airway Mallampati: II TM Distance: >3 FB Neck ROM: Full    Dental no notable dental hx.    Pulmonary neg pulmonary ROS,  breath sounds clear to auscultation  Pulmonary exam normal       Cardiovascular negative cardio ROS  Rhythm:Regular Rate:Normal     Neuro/Psych negative neurological ROS  negative psych ROS   GI/Hepatic negative GI ROS, Neg liver ROS,   Endo/Other  negative endocrine ROS  Renal/GU negative Renal ROS     Musculoskeletal negative musculoskeletal ROS (+)   Abdominal   Peds  Hematology negative hematology ROS (+)   Anesthesia Other Findings   Reproductive/Obstetrics negative OB ROS                           Anesthesia Physical  Anesthesia Plan  ASA: II  Anesthesia Plan: MAC   Post-op Pain Management:    Induction: Intravenous  Airway Management Planned:   Additional Equipment:   Intra-op Plan:   Post-operative Plan:   Informed Consent: I have reviewed the patients History and Physical, chart, labs and discussed the procedure including the risks, benefits and alternatives for the proposed anesthesia with the patient or authorized representative who has indicated his/her understanding and acceptance.   Dental advisory given  Plan Discussed with: CRNA  Anesthesia Plan Comments:        Anesthesia Quick Evaluation

## 2013-10-06 NOTE — Progress Notes (Signed)
ANTIBIOTIC CONSULT NOTE - INITIAL  Pharmacy Consult for Vancomycin Indication: rule out pneumonia  No Known Allergies  Patient Measurements: Height: 4' 9.5" (146.1 cm) Weight: 169 lb 3.2 oz (76.749 kg) IBW/kg (Calculated) : 39.75  Vital Signs: Temp: 98.2 F (36.8 C) (08/26 0935) Temp src: Oral (08/26 0935) BP: 121/67 mmHg (08/26 1450) Pulse Rate: 85 (08/26 1450) Intake/Output from previous day: 08/25 0701 - 08/26 0700 In: 2561.4 [P.O.:120; I.V.:2414; IV Piggyback:27.4] Out: 2530 [Urine:1100; Drains:1430] Intake/Output from this shift: Total I/O In: 700 [I.V.:700] Out: 777 [Urine:350; Drains:427]  Labs:  Recent Labs  10/04/13 0500 10/05/13 0635 10/06/13 0505  WBC 13.6* 14.8* 14.2*  HGB 9.5* 9.6* 9.6*  PLT 195 258 PLATELET CLUMPS NOTED ON SMEAR, UNABLE TO ESTIMATE  CREATININE 0.49*  --   --    Estimated Creatinine Clearance: 74.9 ml/min (by C-G formula based on Cr of 0.49). No results found for this basename: VANCOTROUGH, VANCOPEAK, VANCORANDOM, GENTTROUGH, GENTPEAK, GENTRANDOM, TOBRATROUGH, TOBRAPEAK, TOBRARND, AMIKACINPEAK, AMIKACINTROU, AMIKACIN,  in the last 72 hours   Microbiology: No results found for this or any previous visit (from the past 720 hour(s)). 8/26 sputum: sent  Medical History: Past Medical History  Diagnosis Date  . Liver mass   . Hyperlipidemia   . Pain     PAIN - BURNING SENSATION RIGHT SIDE - BLOOD WORK ABNORMAL - FOUND TO HAVE LIVER MASS  . Colitis     Medications:  Scheduled:  . acetaminophen  1,000 mg Oral TID  . bisacodyl  10 mg Rectal Daily  . lactated ringers  1,000 mL Intravenous Once  . lip balm  1 application Topical BID  . metoprolol  5 mg Intravenous 4 times per day  . morphine   Intravenous 6 times per day  . piperacillin-tazobactam (ZOSYN)  IV  3.375 g Intravenous Q8H  . saccharomyces boulardii  250 mg Oral BID   Infusions:  . bupivacaine 0.25 % ON-Q pump DUAL CATH 300 mL    . dextrose 5 % and 0.45 % NaCl with KCl  20 mEq/L 100 mL/hr at 10/06/13 1031   PRN: alum & mag hydroxide-simeth, diphenhydrAMINE, diphenhydrAMINE, magic mouthwash, menthol-cetylpyridinium, morphine injection, naloxone, ondansetron (ZOFRAN) IV, ondansetron, oxyCODONE, phenol, promethazine, sodium chloride  Assessment: 48 y/o F with painful hepatic angioma and mild chronic cholecystitis, underwent elective partial R hepatectomy and cholecystectomy on 8/20, ERCP with sphincterotomy/papillotomy and ERCP with stent placement. Zosyn started 8/24 for intraabdominal infection, now adding Vanc 8/26 for presumed PNA, CXR pending.  8/24 >> Zosyn >> 8/26 >> Vanc >>   Tmax: AF WBCs: elevated/rising Renal: SCr low, CrCl 75CG, 98N (using 0.8)  8/26 sputum: sent  Goal of Therapy:  Vancomycin trough level 15-20 mcg/ml  Plan:   Vancomycin 1500mg  IV x 1, then 1g IV q12h Check trough at steady state Follow up renal function & cultures  Peggyann Juba, PharmD, BCPS Pager: [phone] 10/06/2013,3:06 PM

## 2013-10-06 NOTE — Progress Notes (Signed)
PT Cancellation Note  ___Treatment cancelled today due to medical issues with patient which prohibited therapy  ___ Treatment cancelled today due to patient receiving procedure or test   ___ Treatment cancelled today due to patient's refusal to participate   _X_ Treatment cancelled today due to pt declined "too tiered" in am then family stated pt was going to Endo later.  Will check back as schedule permits.  Rica Koyanagi  PTA [facility]  Acute  Rehab Pager      [phone]

## 2013-10-06 NOTE — Op Note (Signed)
[facility] [place], [address]   ERCP PROCEDURE REPORT  PATIENT: Deborah, Trujillo  MR# :[id number] BIRTHDATE: 1965/12/17  GENDER: Female ENDOSCOPIST: Arta Silence, MD REFERRED BY: Stark Klein, M.D. PROCEDURE DATE:  10/06/2013 PROCEDURE:   ERCP with sphincterotomy/papillotomy and ERCP with stent placement ASA CLASS:    ASA-II INDICATIONS:  Bile leak MEDICATIONS:    General endotracheal anesthesia, Zosyn 3.375 (on call to procedure), glucagon 0.5 mg IV  DESCRIPTION OF PROCEDURE:   After the risks benefits and alternatives of the procedure were thoroughly explained, informed consent was obtained.  The side-viewing duodenoscope was introduced through the mouth and advanced to the second portion of the duodenum .    FINDINGS:  Incidental notation of multiple superficial erosions in the duodenal bulb and descending duodenum.  Deep biliary access achieved.  Narrow caliber common bile duct and left intrahepatic duct.  No evidence of extrahepatic bile duct stricture or mass. Just upstream from the biliary confluence on the right mainstem intrahepatic branch, there were two distinct areas of contrast blushing.  Post-cholecystectom.  Post-cholecystectomy anatomy noted.  Contrast blushing does not appear to arise from the cystic duct stump.  ENDOSCOPIC IMPRESSION:  As above.  Bile leak apparent just upstream from right main intrahepatic bile duct, likely related to patient's prior right liver lesion removal.  RECOMMENDATIONS: 1.  Watch for potential complications of procedure. 2.  Follow clinical response and drain output to stent placement. 3.  If JP drain output continues to be brisk, doubt there is any utility for any further GI tract manipulation. 4.  [facility] will follow.     _______________________________ Lorrin MaisArta Silence, MD 10/06/2013 2:37 PM   CC:

## 2013-10-06 NOTE — H&P (View-Only) (Signed)
[facility] CONSULT Reason for consult: possible bile leak Referring Physician: Dr Barry Dienes. Primary G.I.: Dr. Wilford Grist Deborah Trujillo an 48 y.o. female.  HPI: 48 year old Hispanic female who does not speak [place]. She was evaluated by Dr. Paulita Fujita recently for abdominal pain. She was found to have a hepatic mass and underwent partial right hepatectomy and cholecystectomy 8/20. 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 The patient had been doing reasonably well but over the past 2 days has had marked increase in output from her Deborah Trujillo. We're asked to see her regarding possible bile leak and whether ERCP would be needed. Currently, there Trujillo no one in her room who speaks English so I'm unable to really get much history about her symptoms at this time.  Past Medical History  Diagnosis Date  . Liver mass   . Hyperlipidemia   . Pain     PAIN - BURNING SENSATION RIGHT SIDE - BLOOD WORK ABNORMAL - FOUND TO HAVE LIVER MASS  . Colitis     Past Surgical History  Procedure Laterality Date  . Neck mass excision  2009    absess removed   . Tubal ligation    . Laparoscopic partial hepatectomy Right 09/30/2013    Procedure: PARTIAL RIGHT HEPATECTOMY; CHOLECYSTECTOMY;  Surgeon: Stark Klein, MD;  Location: [facility];  Service: General;  Laterality: Right;    Family History  Problem Relation Age of Onset  . Diabetes Mother     Social History:  reports that she has never smoked. She has never used smokeless tobacco. She reports that she does not drink alcohol or use illicit drugs.  Allergies: No Known Allergies  Medications; Prior to Admission medications   Not on File   . acetaminophen  1,000 mg Oral TID  . bisacodyl  10 mg Rectal Daily  . lactated ringers  1,000 mL Intravenous Once  . lip balm  1 application Topical BID  . metoprolol  5 mg Intravenous 4 times per day  . morphine   Intravenous 6 times per day  .  piperacillin-tazobactam (ZOSYN)  IV  3.375 g Intravenous Q8H  . saccharomyces boulardii  250 mg Oral BID   PRN Meds alum & mag hydroxide-simeth, diphenhydrAMINE, diphenhydrAMINE, magic mouthwash, menthol-cetylpyridinium, morphine injection, naloxone, ondansetron (ZOFRAN) IV, ondansetron, oxyCODONE, phenol, promethazine, sodium chloride Results for orders placed during the hospital encounter of 09/30/13 (from the past 48 hour(s))  CBC     Status: Abnormal   Collection Time    10/04/13  5:00 AM      Result Value Ref Range   WBC 13.6 (*) 4.0 - 10.5 K/uL   RBC 3.16 (*) 3.87 - 5.11 MIL/uL   Hemoglobin 9.5 (*) 12.0 - 15.0 g/dL   HCT 27.4 (*) 36.0 - 46.0 %   MCV 86.7  78.0 - 100.0 fL   MCH 30.1  26.0 - 34.0 pg   MCHC 34.7  30.0 - 36.0 g/dL   RDW 14.4  11.5 - 15.5 %   Platelets 195  150 - 400 K/uL   Comment: DELTA CHECK NOTED     REPEATED TO VERIFY  COMPREHENSIVE METABOLIC PANEL     Status: Abnormal   Collection Time    10/04/13  5:00 AM      Result Value Ref Range   Sodium 136 (*) 137 - 147 mEq/L   Potassium 3.8  3.7 - 5.3 mEq/L   Chloride 103  96 - 112 mEq/L  CO2 25  19 - 32 mEq/L   Glucose, Bld 124 (*) 70 - 99 mg/dL   BUN 9  6 - 23 mg/dL   Creatinine, Ser 0.49 (*) 0.50 - 1.10 mg/dL   Calcium 7.5 (*) 8.4 - 10.5 mg/dL   Total Protein 4.8 (*) 6.0 - 8.3 g/dL   Albumin 1.9 (*) 3.5 - 5.2 g/dL   AST 63 (*) 0 - 37 U/L   ALT 256 (*) 0 - 35 U/L   Alkaline Phosphatase 49  39 - 117 U/L   Total Bilirubin 2.1 (*) 0.3 - 1.2 mg/dL   GFR calc non Af Amer >90  >90 mL/min   GFR calc Af Amer >90  >90 mL/min   Comment: (NOTE)     The eGFR has been calculated using the CKD EPI equation.     This calculation has not been validated in all clinical situations.     eGFR's persistently <90 mL/min signify possible Chronic Kidney     Disease.   Anion gap 8  5 - 15  CBC     Status: Abnormal   Collection Time    10/05/13  6:35 AM      Result Value Ref Range   WBC 14.8 (*) 4.0 - 10.5 K/uL   RBC 3.19  (*) 3.87 - 5.11 MIL/uL   Hemoglobin 9.6 (*) 12.0 - 15.0 g/dL   HCT 27.4 (*) 36.0 - 46.0 %   MCV 85.9  78.0 - 100.0 fL   MCH 30.1  26.0 - 34.0 pg   MCHC 35.0  30.0 - 36.0 g/dL   RDW 14.6  11.5 - 15.5 %   Platelets 258  150 - 400 K/uL   Comment: REPEATED TO VERIFY     DELTA CHECK NOTED    No results found.             Blood pressure 133/84, pulse 88, temperature 98.8 F (37.1 C), temperature source Oral, resp. rate 18, height 4' 9.5" (1.461 m), weight 76.749 kg (169 lb 3.2 oz), SpO2 95.00%.  Physical exam:   General-- pleasant Hispanic female no severe distress  Heart-- regular rate and rhythm without murmurs are gallops Lungs--clear Abdomen-- somewhat quiet with appropriate tenderness. There's only a small amount of bile in the [employer] drained currently.   Assessment: 1. Status post resection of hepatic hemangioma and cholecystectomy. There Trujillo increase in output from the [employer] drain. Download this point whether the patient needs some other procedure to rule out bile leak such as hepatobiliary scan or CT. May need to do ERCP.  Plan: will discuss with Dr. Paulita Fujita and see if he can discuss with Dr Barry Dienes tomorrow. Did not have an interpreter comment this point since not really sure what will tell the patient at this time.   Deborah Trujillo JR,Jimmie Rueter L 10/05/2013, 5:53 PM

## 2013-10-07 ENCOUNTER — Encounter (HOSPITAL_COMMUNITY): Payer: Self-pay | Admitting: Gastroenterology

## 2013-10-07 LAB — CBC
HCT: 26.2 % — ABNORMAL LOW (ref 36.0–46.0)
Hemoglobin: 8.9 g/dL — ABNORMAL LOW (ref 12.0–15.0)
MCH: 29.4 pg (ref 26.0–34.0)
MCHC: 34 g/dL (ref 30.0–36.0)
MCV: 86.5 fL (ref 78.0–100.0)
Platelets: 362 10*3/uL (ref 150–400)
RBC: 3.03 MIL/uL — ABNORMAL LOW (ref 3.87–5.11)
RDW: 15.1 % (ref 11.5–15.5)
WBC: 12.5 10*3/uL — ABNORMAL HIGH (ref 4.0–10.5)

## 2013-10-07 LAB — CLOSTRIDIUM DIFFICILE BY PCR: Toxigenic C. Difficile by PCR: NEGATIVE

## 2013-10-07 MED ORDER — PROMETHAZINE HCL 25 MG/ML IJ SOLN: 6.2500 mg | Freq: Four times a day (QID) | INTRAMUSCULAR | Status: DC | PRN

## 2013-10-07 MED ORDER — CETYLPYRIDINIUM CHLORIDE 0.05 % MT LIQD
7.0000 mL | Freq: Two times a day (BID) | OROMUCOSAL | Status: DC
  Administered 2013-10-07: 7 mL via OROMUCOSAL

## 2013-10-07 MED ORDER — CHOLESTYRAMINE 4 G PO PACK
4.0000 g | PACK | Freq: Three times a day (TID) | ORAL | Status: DC
  Administered 2013-10-07 – 2013-10-09 (×6): 4 g via ORAL
  Filled 2013-10-07 (×10): qty 1

## 2013-10-07 MED ORDER — CHLORHEXIDINE GLUCONATE 0.12 % MT SOLN
15.0000 mL | Freq: Two times a day (BID) | OROMUCOSAL | Status: DC
  Administered 2013-10-07 – 2013-10-08 (×3): 15 mL via OROMUCOSAL
  Filled 2013-10-07 (×4): qty 15

## 2013-10-07 NOTE — Progress Notes (Signed)
CARE MANAGEMENT NOTE 10/07/2013  Patient:  Deborah Trujillo, Deborah Trujillo   Account Number:  [id number]  Date Initiated:  10/01/2013  Documentation initiated by:  Dessa Phi  Subjective/Objective Assessment:   48 Y/O F ADMITTED [facility] OF LIVER.     Action/Plan:   FROM HOME.NO INSURANCE.SPEAKS SPANISH.   Anticipated DC Date:  10/10/2013   Anticipated DC Plan:  HOME/SELF CARE  In-house referral  Interpreting [facility]  CM consult      Choice offered to / List presented to:             Status of service:  In process, will continue to follow Medicare Important Message given?  NA - LOS <3 / Initial given by admissions (If response is "NO", the following Medicare IM given date fields will be blank) Date Medicare IM given:   Medicare IM given by:   Date Additional Medicare IM given:   Additional Medicare IM given by:    Discharge Disposition:    Per UR Regulation:  Reviewed for med. necessity/level of care/duration of stay  If discussed at Long Length of Stay Meetings, dates discussed:    Comments:  [id number]/Rhonda Davis,RN,BSN,CCM: Patient had ercp on pm of [id number] due to bile leak, transferred to sdu post op due to [place]. sepsis and resp distress. Patient wbc 14.2 post procedure/ 02 sat dropped and patient did experience some increased wob.  Brisk drainage and large amount of biliary outpt alone with increased diarrhea -poss.Marland Kitchen c-diff. 10/01/13 KATHY MAHABIR RN,BSN NCM [place]IT[place].

## 2013-10-07 NOTE — Progress Notes (Signed)
[facility] PROGRESS NOTE Subjective patient states that she feels better. She has been moved to the unit but is doing better. There is a significant decrease in drainage from her Terrial Rhodes overnight.  Objective: Vital signs in last 24 hours: Temp:  [98 F (36.7 C)-99.1 F (37.3 C)] 99.1 F (37.3 C) (08/27 1600) Pulse Rate:  [61-89] 79 (08/27 1600) Resp:  [15-23] 15 (08/27 1600) BP: (89-111)/(44-66) 108/61 mmHg (08/27 1600) SpO2:  [92 %-97 %] 96 % (08/27 1600) Last BM Date: 10/07/13  Intake/Output from previous day: 08/26 0701 - 08/27 0700 In: 3160 [P.O.:360; I.V.:2000; IV Piggyback:800] Out: 1437 [Urine:850; Drains:587] Intake/Output this shift: Total I/O In: 950 [I.V.:900; IV Piggyback:50] Out: 25 [Drains:25]  PE:  Abdomen-- still quiet and nontender little change clinically  Lab Results:  Recent Labs  10/05/13 0635 10/06/13 0505 10/07/13 0345  WBC 14.8* 14.2* 12.5*  HGB 9.6* 9.6* 8.9*  HCT 27.4* 27.9* 26.2*  PLT 258 PLATELET CLUMPS NOTED ON SMEAR, UNABLE TO ESTIMATE 362   BMET No results found for this basename: NA, K, CL, CO2, CREATININE,  in the last 72 hours LFT No results found for this basename: PROT, AST, ALT, ALKPHOS, BILITOT, BILIDIR, IBILI,  in the last 72 hours PT/INR No results found for this basename: LABPROT, INR,  in the last 72 hours PANCREAS No results found for this basename: LIPASE,  in the last 72 hours       Studies/Results: Dg Chest Port 1 View  10/06/2013   CLINICAL DATA:  Status post ERCP.  Cough.  EXAM: PORTABLE CHEST - 1 VIEW  COMPARISON:  Single view of the chest 10/01/2013 and 09/27/2013.  FINDINGS: Right pleural effusion and basilar airspace disease persist. There has been some increase in left basilar opacity which is much milder than that seen on the right. No pneumothorax is identified. Heart size is upper normal. Drainage catheter projecting in the right upper quadrant of the abdomen is noted.  IMPRESSION:  Right effusion and basilar airspace disease without marked change. Milder degree of left basilar airspace opacity has worsened. Airspace disease could be due to atelectasis or pneumonia.   Electronically Signed   By: Inge Rise M.D.   On: 10/06/2013 15:15   Dg Ercp Biliary & Pancreatic Ducts  10/06/2013   CLINICAL DATA:  bilary leak  EXAM: ERCP  TECHNIQUE: Multiple spot images obtained with the fluoroscopic device and submitted for interpretation post-procedure.  COMPARISON:  MRCP 08/03/2013  FINDINGS: A series of 5 fluoroscopic spot images document endoscopic cannulation and opacification of the CBD which is nondilated. There is incomplete opacification of central intrahepatic bile ducts which appear decompressed. Some probable extravasation is noted lateral to the biliary confluence. Final image documents placement of a plastic biliary stent in expected location.  IMPRESSION: 1. Endoscopic biliary stenting.  These images were submitted for radiologic interpretation only. Please see the procedural report for the amount of contrast and the fluoroscopy time utilized.   Electronically Signed   By: Arne Cleveland M.D.   On: 10/06/2013 15:08    Medications: I have reviewed the patient's current medications.  Assessment/Plan: 1. Probable bile leak. Appears to be better with placement of biliary stent. This will probably need to be removed in 6 to 8 weeks. Would arrange for patient to see Dr. Paulita Fujita in that time to arrange that. Please call us back for any further problems.   Banyan Goodchild JR,Jahlia Omura L 10/07/2013, 4:52 PM

## 2013-10-07 NOTE — Progress Notes (Signed)
Patient ID: Deborah Trujillo, female   DOB: December 14, 1965, 48 y.o.   MRN: [id number] 1 Day Post-Op   Subjective: Sats better overnight.  Drain output cut in half since stent, but now having diarrhea.      Objective: Vital signs in last 24 hours: Temp:  [98.2 F (36.8 C)-98.9 F (37.2 C)] 98.9 F (37.2 C) (08/26 2000) Pulse Rate:  [61-91] 61 (08/27 0600) Resp:  [15-25] 20 (08/27 0600) BP: (89-149)/(44-74) 90/46 mmHg (08/27 0600) SpO2:  [90 %-97 %] 93 % (08/27 0600) Weight:  [169 lb 12.1 oz (77 kg)] 169 lb 12.1 oz (77 kg) (08/26 1615) Last BM Date: 10/05/13  Intake/Output from previous day: 08/26 0701 - 08/27 0700 In: 3060 [P.O.:360; I.V.:1900; IV Piggyback:800] Out: 0814 [Urine:850; Drains:587] Intake/Output this shift:    General appearance: alert, cooperative and mild distress Resp: breathing comfortably GI: soft, approp tender Extremities: no edema JP with bile in drain.    Lab Results:   Recent Labs  10/06/13 0505 10/07/13 0345  WBC 14.2* 12.5*  HGB 9.6* 8.9*  HCT 27.9* 26.2*  PLT PLATELET CLUMPS NOTED ON SMEAR, UNABLE TO ESTIMATE 362   BMET No results found for this basename: NA, K, CL, CO2, GLUCOSE, BUN, CREATININE, CALCIUM,  in the last 72 hours PT/INR No results found for this basename: LABPROT, INR,  in the last 72 hours ABG No results found for this basename: PHART, PCO2, PO2, HCO3,  in the last 72 hours  Studies/Results: Dg Chest Port 1 View  10/06/2013   CLINICAL DATA:  Status post ERCP.  Cough.  EXAM: PORTABLE CHEST - 1 VIEW  COMPARISON:  Single view of the chest 10/01/2013 and 09/27/2013.  FINDINGS: Right pleural effusion and basilar airspace disease persist. There has been some increase in left basilar opacity which is much milder than that seen on the right. No pneumothorax is identified. Heart size is upper normal. Drainage catheter projecting in the right upper quadrant of the abdomen is noted.  IMPRESSION: Right effusion and basilar airspace  disease without marked change. Milder degree of left basilar airspace opacity has worsened. Airspace disease could be due to atelectasis or pneumonia.   Electronically Signed   By: Inge Rise M.D.   On: 10/06/2013 15:15   Dg Ercp Biliary & Pancreatic Ducts  10/06/2013   CLINICAL DATA:  bilary leak  EXAM: ERCP  TECHNIQUE: Multiple spot images obtained with the fluoroscopic device and submitted for interpretation post-procedure.  COMPARISON:  MRCP 08/03/2013  FINDINGS: A series of 5 fluoroscopic spot images document endoscopic cannulation and opacification of the CBD which is nondilated. There is incomplete opacification of central intrahepatic bile ducts which appear decompressed. Some probable extravasation is noted lateral to the biliary confluence. Final image documents placement of a plastic biliary stent in expected location.  IMPRESSION: 1. Endoscopic biliary stenting.  These images were submitted for radiologic interpretation only. Please see the procedural report for the amount of contrast and the fluoroscopy time utilized.   Electronically Signed   By: Arne Cleveland M.D.   On: 10/06/2013 15:08    Anti-infectives: Anti-infectives   Start     Dose/Rate Route Frequency Ordered Stop   10/07/13 0600  vancomycin (VANCOCIN) IVPB 1000 mg/200 mL premix     1,000 mg 200 mL/hr over 60 Minutes Intravenous Every 12 hours 10/06/13 1513     10/06/13 1600  vancomycin (VANCOCIN) 1,500 mg in sodium chloride 0.9 % 500 mL IVPB     1,500 mg 250 mL/hr over 120  Minutes Intravenous  Once 10/06/13 1513 10/06/13 1916   10/04/13 1000  piperacillin-tazobactam (ZOSYN) IVPB 3.375 g     3.375 g 12.5 mL/hr over 240 Minutes Intravenous Every 8 hours 10/04/13 0900     09/30/13 1400  ceFAZolin (ANCEF) IVPB 1 g/50 mL premix     1 g 100 mL/hr over 30 Minutes Intravenous Every 6 hours 09/30/13 1315 10/01/13 0212   09/30/13 0608  ceFAZolin (ANCEF) IVPB 2 g/50 mL premix     2 g 100 mL/hr over 30 Minutes Intravenous  On call to O.R. 09/30/13 9753 09/30/13 0755      Assessment/Plan: s/p Procedure(s): ENDOSCOPIC RETROGRADE CHOLANGIOPANCREATOGRAPHY (ERCP) (N/A) PNA - vanc/zosyn. Pulmonary toilet. Incentive spirometer.   Bile leak - stent, antibiotics, add questran Diarrhea - likely related to stent, but unit sending c diff which is reasonable. Advance diet as tolerated.  ambulate  LOS: 7 days    [facility] 10/07/2013

## 2013-10-07 NOTE — Progress Notes (Signed)
Physical Therapy Treatment Patient Details Name: Deborah Trujillo MRN: [id number] DOB: February 26, 1965 Today's Date: 10/07/2013    History of Present Illness 48 yo female admitted 09/30/13 with R flank pain. s/p partial right hepatectomy with enucleation of hemangioma, cholecystectomy - 09/30/2013     PT Comments    Pt in ICU with 4 family members present who were very helpful to translate.  Pt requested to use bathroom so assisted pt OOB to amb to BR.  Pt still present with loose stools.  Assisted with hygiene.  Amb in hallway as son followed with recliner.  Pt required increased time and 4 standing rest breaks.  Amb on 2 lts O2 sats avg 92%.  Mild c/o dizziness.  Pt tolerated amb 85 feet.  Follow Up Recommendations  No PT follow up     Equipment Recommendations  None recommended by PT    Recommendations for Other Services       Precautions / Restrictions Precautions Precaution Comments: multiple drains, lines,  Restrictions Weight Bearing Restrictions: No    Mobility  Bed Mobility Overal bed mobility: Needs Assistance Bed Mobility: Supine to Sit     Supine to sit: Mod assist;Max assist     General bed mobility comments: cues for technique and for abdominal protection plus increased time  Transfers Overall transfer level: Needs assistance Equipment used: Rolling walker (2 wheeled) Transfers: Sit to/from [employer] Transfers Sit to Stand: +2 physical assistance;Min assist Stand pivot transfers: +2 physical assistance;+2 safety/equipment;Min assist       General transfer comment: cueing on hand placement and increased time  Ambulation/Gait Ambulation/Gait assistance: +2 physical assistance;+2 safety/equipment;Min assist Ambulation Distance (Feet): 85 Feet Assistive device: Rolling walker (2 wheeled) Gait Pattern/deviations: Step-to pattern;Step-through pattern;Trunk flexed Gait velocity: decreased   General Gait Details: slow but steady gait with mild c/o  dizziness.  Amb on 3 lts O2 sats avg 92% with HR 72.  Son assisted by following with recliner.     Stairs            Wheelchair Mobility    Modified Rankin (Stroke Patients Only)       Balance                                    Cognition                            Exercises      General Comments        Pertinent Vitals/Pain      Home Living                      Prior Function            PT Goals (current goals can now be found in the care plan section) Progress towards PT goals: Progressing toward goals    Frequency  Min 3X/week    PT Plan      Co-evaluation             End of Session Equipment Utilized During Treatment: Gait belt Activity Tolerance: Patient tolerated treatment well Patient left: in chair;with call bell/phone within reach;with family/visitor present     Time: 1210-1255 PT Time Calculation (min): 45 min  Charges:  $Gait Training: 8-22 mins $Therapeutic Activity: 23-37 mins  G Codes:      Rica Koyanagi  PTA [facility]  Acute  Rehab Pager      [phone]

## 2013-10-08 LAB — CREATININE, SERUM: Creatinine, Ser: 0.55 mg/dL (ref 0.50–1.10)

## 2013-10-08 LAB — GLUCOSE, CAPILLARY
GLUCOSE-CAPILLARY: 91 mg/dL (ref 70–99)
Glucose-Capillary: 124 mg/dL — ABNORMAL HIGH (ref 70–99)
Glucose-Capillary: 139 mg/dL — ABNORMAL HIGH (ref 70–99)

## 2013-10-08 LAB — CBC
HEMATOCRIT: 26.3 % — AB (ref 36.0–46.0)
Hemoglobin: 8.7 g/dL — ABNORMAL LOW (ref 12.0–15.0)
MCH: 29.2 pg (ref 26.0–34.0)
MCHC: 33.1 g/dL (ref 30.0–36.0)
MCV: 88.3 fL (ref 78.0–100.0)
PLATELETS: 429 10*3/uL — AB (ref 150–400)
RBC: 2.98 MIL/uL — ABNORMAL LOW (ref 3.87–5.11)
RDW: 15.3 % (ref 11.5–15.5)
WBC: 11.4 10*3/uL — ABNORMAL HIGH (ref 4.0–10.5)

## 2013-10-08 LAB — VANCOMYCIN, TROUGH: Vancomycin Tr: 6.4 ug/mL — ABNORMAL LOW (ref 10.0–20.0)

## 2013-10-08 MED ORDER — VANCOMYCIN HCL IN DEXTROSE 1-5 GM/200ML-% IV SOLN
1000.0000 mg | Freq: Three times a day (TID) | INTRAVENOUS | Status: DC
  Administered 2013-10-08 – 2013-10-10 (×5): 1000 mg via INTRAVENOUS
  Filled 2013-10-08 (×5): qty 200

## 2013-10-08 NOTE — Progress Notes (Addendum)
ANTIBIOTIC CONSULT NOTE - Follow Up  Pharmacy Consult for Vancomycin Indication: rule out pneumonia  No Known Allergies  Patient Measurements: Height: 4\' 8"  (142.2 cm) Weight: 169 lb 12.1 oz (77 kg) IBW/kg (Calculated) : 36.3  Vital Signs: Temp: 98 F (36.7 C) (08/28 0800) Temp src: Oral (08/28 0800) BP: 107/67 mmHg (08/28 0837) Pulse Rate: 69 (08/28 0837) Intake/Output from previous day: 08/27 0701 - 08/28 0700 In: 2650 [P.O.:300; I.V.:1800; IV Piggyback:550] Out: 1095 [Urine:1050; Drains:45] Intake/Output from this shift: Total I/O In: 100 [I.V.:100] Out: -   Labs:  Recent Labs  10/06/13 0505 10/07/13 0345 10/08/13 0350  WBC 14.2* 12.5* 11.4*  HGB 9.6* 8.9* 8.7*  PLT PLATELET CLUMPS NOTED ON SMEAR, UNABLE TO ESTIMATE 362 429*   Estimated Creatinine Clearance: 72.2 ml/min (by C-G formula based on Cr of 0.49). 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 Probable bile leak.  Zosyn started 8/24 for intraabdominal infection, now adding Vanc 8/26 for presumed PNA, CXR pending.  8/24 >> Zosyn >> 8/26 >> Vanc >>   Tmax: AF WBCs: improving, now mildly elevated Renal: SCr stable/low (8/24), CrCl >100 ml/min (CG, TBW), 98 ml/min (normazlied, using adjusted SCr 0.8)  8/26 sputum: ngtd 8/27 Cdiff PCR: neg  Drug level / dose changes info: 8/28 1700 VT: 6.19mcg/ml on 1g q12h prior to 4th maintenance dose  8/28: Day #3 Vanc 1500mg  x1 then 1g q12h, Day #5 Zosyn EI per MD for intra-abdominal infection with suspected PNA. Drain output decreased. No further diarrhea.   Goal of Therapy:  Vancomycin  trough level 15-20 mcg/ml  Plan:   Vancomycin trough subtherapeutic, change to 1gm IV q8h  Need to watch SCr closely, CMP in am  Recheck VT in 2 days  Doreene Eland, PharmD, BCPS.   Pager: [phone] 10/08/2013 6:15 PM

## 2013-10-08 NOTE — Progress Notes (Signed)
Physical Therapy Treatment Patient Details Name: Deborah Trujillo MRN: [id number] DOB: 11/13/1965 Today's Date: 10/08/2013    History of Present Illness 48 yo female admitted 09/30/13 with R flank pain. s/p partial right hepatectomy with enucleation of hemangioma, cholecystectomy - 09/30/2013     PT Comments    Assisted pt OOB to amb to BR then amb in hallway.  Follow Up Recommendations  No PT follow up     Equipment Recommendations  None recommended by PT    Recommendations for Other Services       Precautions / Restrictions Precautions Precaution Comments: multiple drains, lines,  Restrictions Weight Bearing Restrictions: No    Mobility  Bed Mobility Overal bed mobility: Needs Assistance Bed Mobility: Supine to Sit     Supine to sit: Mod assist     General bed mobility comments: cues for technique and for abdominal protection plus increased time  Transfers Overall transfer level: Needs assistance Equipment used: Rolling walker (2 wheeled) Transfers: Sit to/from Stand Sit to Stand: +2 physical assistance;Min assist         General transfer comment: cueing on hand placement and increased time  Ambulation/Gait Ambulation/Gait assistance: +2 safety/equipment;Min guard Ambulation Distance (Feet): 250 Feet Assistive device: Rolling walker (2 wheeled) Gait Pattern/deviations: Step-through pattern;Decreased stride length;Trunk flexed Gait velocity: decreased   General Gait Details: slow but staedy amb on RA avg 92%.  No c/o dizziness.  tolerated amb around full unit.   Stairs            Wheelchair Mobility    Modified Rankin (Stroke Patients Only)       Balance                                    Cognition                            Exercises      General Comments        Pertinent Vitals/Pain      Home Living                      Prior Function            PT Goals (current goals can now be  found in the care plan section) Progress towards PT goals: Progressing toward goals    Frequency  Min 3X/week    PT Plan      Co-evaluation             End of Session Equipment Utilized During Treatment: Gait belt Activity Tolerance: Patient tolerated treatment well Patient left: in chair;with call bell/phone within reach;with family/visitor present     Time: 1000-1030 PT Time Calculation (min): 30 min  Charges:  $Gait Training: 8-22 mins $Therapeutic Activity: 8-22 mins                    G Codes:      Rica Koyanagi  PTA [facility]  Acute  Rehab Pager      [phone]

## 2013-10-08 NOTE — Progress Notes (Signed)
Patient ID: Deborah Trujillo, female   DOB: 04-24-1965, 48 y.o.   MRN: [id number] 2 Days Post-Op   Subjective: No additional diarrhea.  Drain output way down.    Objective: Vital signs in last 24 hours: Temp:  [98 F (36.7 C)-99.1 F (37.3 C)] 98 F (36.7 C) (08/28 0800) Pulse Rate:  [65-82] 67 (08/28 0400) Resp:  [15-23] 18 (08/28 0400) BP: (103-118)/(52-65) 103/55 mmHg (08/28 0400) SpO2:  [94 %-97 %] 94 % (08/28 0400) Last BM Date: 10/07/13 (day shift per RN shift report)  Intake/Output from previous day: 08/27 0701 - 08/28 0700 In: 2650 [P.O.:300; I.V.:1800; IV Piggyback:550] Out: 6644 [Urine:1050; Drains:45] Intake/Output this shift:    General appearance: alert, cooperative and mild distress Resp: breathing comfortably GI: soft, approp tender Extremities: no edema JP with bile in drain.    Lab Results:   Recent Labs  10/07/13 0345 10/08/13 0350  WBC 12.5* 11.4*  HGB 8.9* 8.7*  HCT 26.2* 26.3*  PLT 362 429*   BMET No results found for this basename: NA, K, CL, CO2, GLUCOSE, BUN, CREATININE, CALCIUM,  in the last 72 hours PT/INR No results found for this basename: LABPROT, INR,  in the last 72 hours ABG No results found for this basename: PHART, PCO2, PO2, HCO3,  in the last 72 hours  Studies/Results: Dg Chest Port 1 View  10/06/2013   CLINICAL DATA:  Status post ERCP.  Cough.  EXAM: PORTABLE CHEST - 1 VIEW  COMPARISON:  Single view of the chest 10/01/2013 and 09/27/2013.  FINDINGS: Right pleural effusion and basilar airspace disease persist. There has been some increase in left basilar opacity which is much milder than that seen on the right. No pneumothorax is identified. Heart size is upper normal. Drainage catheter projecting in the right upper quadrant of the abdomen is noted.  IMPRESSION: Right effusion and basilar airspace disease without marked change. Milder degree of left basilar airspace opacity has worsened. Airspace disease could be due to  atelectasis or pneumonia.   Electronically Signed   By: Inge Rise M.D.   On: 10/06/2013 15:15   Dg Ercp Biliary & Pancreatic Ducts  10/06/2013   CLINICAL DATA:  bilary leak  EXAM: ERCP  TECHNIQUE: Multiple spot images obtained with the fluoroscopic device and submitted for interpretation post-procedure.  COMPARISON:  MRCP 08/03/2013  FINDINGS: A series of 5 fluoroscopic spot images document endoscopic cannulation and opacification of the CBD which is nondilated. There is incomplete opacification of central intrahepatic bile ducts which appear decompressed. Some probable extravasation is noted lateral to the biliary confluence. Final image documents placement of a plastic biliary stent in expected location.  IMPRESSION: 1. Endoscopic biliary stenting.  These images were submitted for radiologic interpretation only. Please see the procedural report for the amount of contrast and the fluoroscopy time utilized.   Electronically Signed   By: Arne Cleveland M.D.   On: 10/06/2013 15:08    Anti-infectives: Anti-infectives   Start     Dose/Rate Route Frequency Ordered Stop   10/07/13 0600  vancomycin (VANCOCIN) IVPB 1000 mg/200 mL premix     1,000 mg 200 mL/hr over 60 Minutes Intravenous Every 12 hours 10/06/13 1513     10/06/13 1600  vancomycin (VANCOCIN) 1,500 mg in sodium chloride 0.9 % 500 mL IVPB     1,500 mg 250 mL/hr over 120 Minutes Intravenous  Once 10/06/13 1513 10/06/13 1916   10/04/13 1000  piperacillin-tazobactam (ZOSYN) IVPB 3.375 g     3.375 g 12.5 mL/hr  over 240 Minutes Intravenous Every 8 hours 10/04/13 0900     09/30/13 1400  ceFAZolin (ANCEF) IVPB 1 g/50 mL premix     1 g 100 mL/hr over 30 Minutes Intravenous Every 6 hours 09/30/13 1315 10/01/13 0212   09/30/13 0608  ceFAZolin (ANCEF) IVPB 2 g/50 mL premix     2 g 100 mL/hr over 30 Minutes Intravenous On call to O.R. 09/30/13 [id number] 09/30/13 0755      Assessment/Plan: s/p Procedure(s): ENDOSCOPIC RETROGRADE  CHOLANGIOPANCREATOGRAPHY (ERCP) (N/A) PNA - vanc/zosyn. Pulmonary toilet. Incentive spirometer.   Bile leak - stent, antibiotics, questran Diarrhea - resolved Diet as tolerated.  ambulate  LOS: 8 days    Deborah Trujillo 10/08/2013

## 2013-10-09 LAB — COMPREHENSIVE METABOLIC PANEL
ALT: 106 U/L — AB (ref 0–35)
AST: 44 U/L — AB (ref 0–37)
Albumin: 1.8 g/dL — ABNORMAL LOW (ref 3.5–5.2)
Alkaline Phosphatase: 178 U/L — ABNORMAL HIGH (ref 39–117)
Anion gap: 11 (ref 5–15)
BUN: 4 mg/dL — ABNORMAL LOW (ref 6–23)
CHLORIDE: 107 meq/L (ref 96–112)
CO2: 24 meq/L (ref 19–32)
Calcium: 7.7 mg/dL — ABNORMAL LOW (ref 8.4–10.5)
Creatinine, Ser: 0.63 mg/dL (ref 0.50–1.10)
GLUCOSE: 104 mg/dL — AB (ref 70–99)
Potassium: 3.5 mEq/L — ABNORMAL LOW (ref 3.7–5.3)
SODIUM: 142 meq/L (ref 137–147)
Total Bilirubin: 0.6 mg/dL (ref 0.3–1.2)
Total Protein: 4.9 g/dL — ABNORMAL LOW (ref 6.0–8.3)

## 2013-10-09 LAB — CBC
HCT: 25.8 % — ABNORMAL LOW (ref 36.0–46.0)
HEMOGLOBIN: 8.6 g/dL — AB (ref 12.0–15.0)
MCH: 29.8 pg (ref 26.0–34.0)
MCHC: 33.3 g/dL (ref 30.0–36.0)
MCV: 89.3 fL (ref 78.0–100.0)
Platelets: 464 10*3/uL — ABNORMAL HIGH (ref 150–400)
RBC: 2.89 MIL/uL — ABNORMAL LOW (ref 3.87–5.11)
RDW: 15.8 % — ABNORMAL HIGH (ref 11.5–15.5)
WBC: 9.5 10*3/uL (ref 4.0–10.5)

## 2013-10-09 LAB — CULTURE, RESPIRATORY W GRAM STAIN

## 2013-10-09 LAB — CULTURE, RESPIRATORY: CULTURE: NO GROWTH

## 2013-10-09 MED ORDER — COLESTIPOL HCL 1 G PO TABS
2.0000 g | ORAL_TABLET | Freq: Three times a day (TID) | ORAL | Status: DC
  Administered 2013-10-09 – 2013-10-12 (×8): 2 g via ORAL
  Filled 2013-10-09 (×12): qty 2

## 2013-10-09 NOTE — Progress Notes (Signed)
3 Days Post-Op  Subjective: Pt doing well.  Pt with some reflux and nausea no emesis.  ambulating  Objective: Vital signs in last 24 hours: Temp:  [97 F (36.1 C)-99.6 F (37.6 C)] 98.3 F (36.8 C) (08/29 0635) Pulse Rate:  [64-83] 81 (08/29 0635) Resp:  [16-20] 16 (08/29 0635) BP: (96-109)/(50-67) 96/50 mmHg (08/29 0635) SpO2:  [90 %-95 %] 94 % (08/29 0635) Last BM Date: 10/07/13  Intake/Output from previous day: 08/28 0701 - 08/29 0700 In: 2499 [I.V.:2200; IV Piggyback:299] Out: 13 [Drains:13] Intake/Output this shift:    General appearance: alert and cooperative GI: soft, non-tender; bowel sounds normal; no masses,  no organomegaly, JP drain bilious output  Lab Results:   Recent Labs  10/08/13 0350 10/09/13 0520  WBC 11.4* 9.5  HGB 8.7* 8.6*  HCT 26.3* 25.8*  PLT 429* 464*   BMET  Recent Labs  10/08/13 1700 10/09/13 0520  NA  --  142  K  --  3.5*  CL  --  107  CO2  --  24  GLUCOSE  --  104*  BUN  --  4*  CREATININE 0.55 0.63  CALCIUM  --  7.7*    Studies/Results: No results found.  Anti-infectives: Anti-infectives   Start     Dose/Rate Route Frequency Ordered Stop   10/08/13 1830  vancomycin (VANCOCIN) IVPB 1000 mg/200 mL premix     1,000 mg 200 mL/hr over 60 Minutes Intravenous 3 times per day 10/08/13 1823     10/07/13 0600  vancomycin (VANCOCIN) IVPB 1000 mg/200 mL premix  Status:  Discontinued     1,000 mg 200 mL/hr over 60 Minutes Intravenous Every 12 hours 10/06/13 1513 10/08/13 1823   10/06/13 1600  vancomycin (VANCOCIN) 1,500 mg in sodium chloride 0.9 % 500 mL IVPB     1,500 mg 250 mL/hr over 120 Minutes Intravenous  Once 10/06/13 1513 10/06/13 1916   10/04/13 1000  piperacillin-tazobactam (ZOSYN) IVPB 3.375 g     3.375 g 12.5 mL/hr over 240 Minutes Intravenous Every 8 hours 10/04/13 0900     09/30/13 1400  ceFAZolin (ANCEF) IVPB 1 g/50 mL premix     1 g 100 mL/hr over 30 Minutes Intravenous Every 6 hours 09/30/13 1315 10/01/13 0212    09/30/13 0608  ceFAZolin (ANCEF) IVPB 2 g/50 mL premix     2 g 100 mL/hr over 30 Minutes Intravenous On call to O.R. 09/30/13 [id number] 09/30/13 0755      Assessment/Plan: s/p Procedure(s): ENDOSCOPIC RETROGRADE CHOLANGIOPANCREATOGRAPHY (ERCP) (N/A)  PNA - con't vanc/zosyn. Pulmonary toilet.  Incentive spirometer.  Bile leak - stent, antibiotics, questran  Diarrhea - resolved  Diet as tolerated.  ambulate   LOS: 9 days    Rosario Jacks., Anne Hahn 10/09/2013

## 2013-10-09 NOTE — Progress Notes (Signed)
3 Days Post-Op  Subjective: Pt feels better.   Objective: Vital signs in last 24 hours: Temp:  [97 F (36.1 C)-99.6 F (37.6 C)] 98.3 F (36.8 C) (08/29 0635) Pulse Rate:  [76-83] 81 (08/29 0635) Resp:  [16-20] 16 (08/29 0635) BP: (96-109)/(50-61) 96/50 mmHg (08/29 0635) SpO2:  [90 %-94 %] 94 % (08/29 0635) Last BM Date: 10/07/13  Intake/Output from previous day: 08/28 0701 - 08/29 0700 In: 2499 [I.V.:2200; IV Piggyback:299] Out: 13 [Drains:13] Intake/Output this shift: Total I/O In: 120 [P.O.:120] Out: -   Incision/Wound:C/D/I soft NT   JP BILIOUS but minimal.   Lab Results:   Recent Labs  10/08/13 0350 10/09/13 0520  WBC 11.4* 9.5  HGB 8.7* 8.6*  HCT 26.3* 25.8*  PLT 429* 464*   BMET  Recent Labs  10/08/13 1700 10/09/13 0520  NA  --  142  K  --  3.5*  CL  --  107  CO2  --  24  GLUCOSE  --  104*  BUN  --  4*  CREATININE 0.55 0.63  CALCIUM  --  7.7*   PT/INR No results found for this basename: LABPROT, INR,  in the last 72 hours ABG No results found for this basename: PHART, PCO2, PO2, HCO3,  in the last 72 hours  Studies/Results: No results found.  Anti-infectives: Anti-infectives   Start     Dose/Rate Route Frequency Ordered Stop   10/08/13 1830  vancomycin (VANCOCIN) IVPB 1000 mg/200 mL premix     1,000 mg 200 mL/hr over 60 Minutes Intravenous 3 times per day 10/08/13 1823     10/07/13 0600  vancomycin (VANCOCIN) IVPB 1000 mg/200 mL premix  Status:  Discontinued     1,000 mg 200 mL/hr over 60 Minutes Intravenous Every 12 hours 10/06/13 1513 10/08/13 1823   10/06/13 1600  vancomycin (VANCOCIN) 1,500 mg in sodium chloride 0.9 % 500 mL IVPB     1,500 mg 250 mL/hr over 120 Minutes Intravenous  Once 10/06/13 1513 10/06/13 1916   10/04/13 1000  piperacillin-tazobactam (ZOSYN) IVPB 3.375 g     3.375 g 12.5 mL/hr over 240 Minutes Intravenous Every 8 hours 10/04/13 0900     09/30/13 1400  ceFAZolin (ANCEF) IVPB 1 g/50 mL premix     1 g 100 mL/hr  over 30 Minutes Intravenous Every 6 hours 09/30/13 1315 10/01/13 0212   09/30/13 0608  ceFAZolin (ANCEF) IVPB 2 g/50 mL premix     2 g 100 mL/hr over 30 Minutes Intravenous On call to O.R. 09/30/13 1610 09/30/13 0755      Assessment/Plan: s/p Procedure(s): ENDOSCOPIC RETROGRADE CHOLANGIOPANCREATOGRAPHY (ERCP) (N/A) Patient Active Problem List   Diagnosis Date Noted  . Hemangioma of liver s/p partial hepatectomy 09/30/2013 09/30/2013  . Pancreas cyst 07/26/2013  . RUQ abdominal pain 07/21/2012  . Other malaise and fatigue 07/21/2012  looks good and drainage much less Possibly home Sunday.   LOS: 9 days    Deborah Trujillo A. 10/09/2013

## 2013-10-10 LAB — CBC
HCT: 27.7 % — ABNORMAL LOW (ref 36.0–46.0)
Hemoglobin: 9.2 g/dL — ABNORMAL LOW (ref 12.0–15.0)
MCH: 30.1 pg (ref 26.0–34.0)
MCHC: 33.2 g/dL (ref 30.0–36.0)
MCV: 90.5 fL (ref 78.0–100.0)
PLATELETS: 527 10*3/uL — AB (ref 150–400)
RBC: 3.06 MIL/uL — ABNORMAL LOW (ref 3.87–5.11)
RDW: 16 % — AB (ref 11.5–15.5)
WBC: 10 10*3/uL (ref 4.0–10.5)

## 2013-10-10 NOTE — Progress Notes (Signed)
4 Days Post-Op  Subjective: Pt with less reflux overnight. Tol chicken soup with veggies from family Ambulating well, some SOB but feeling better.  Objective: Vital signs in last 24 hours: Temp:  [98.2 F (36.8 C)-99.2 F (37.3 C)] 98.7 F (37.1 C) (08/30 0520) Pulse Rate:  [66-79] 73 (08/30 0520) Resp:  [16] 16 (08/30 0520) BP: (101-115)/(65-74) 102/65 mmHg (08/30 0520) SpO2:  [90 %-94 %] 90 % (08/30 0520) Last BM Date: 10/09/13  Intake/Output from previous day: 08/29 0701 - 08/30 0700 In: 1237.5 [P.O.:480; I.V.:757.5] Out: 40 [Drains:40] Intake/Output this shift: Total I/O In: -  Out: 10 [Drains:10]  General appearance: alert and cooperative Resp: clear to auscultation bilaterally GI: soft, nt, nd active BS, wound c/d/i, JP min bilious  Lab Results:   Recent Labs  10/09/13 0520 10/10/13 0508  WBC 9.5 10.0  HGB 8.6* 9.2*  HCT 25.8* 27.7*  PLT 464* 527*   BMET  Recent Labs  10/08/13 1700 10/09/13 0520  NA  --  142  K  --  3.5*  CL  --  107  CO2  --  24  GLUCOSE  --  104*  BUN  --  4*  CREATININE 0.55 0.63  CALCIUM  --  7.7*   PT/INR No results found for this basename: LABPROT, INR,  in the last 72 hours ABG No results found for this basename: PHART, PCO2, PO2, HCO3,  in the last 72 hours  Studies/Results: No results found.  Anti-infectives: Anti-infectives   Start     Dose/Rate Route Frequency Ordered Stop   10/08/13 1830  vancomycin (VANCOCIN) IVPB 1000 mg/200 mL premix     1,000 mg 200 mL/hr over 60 Minutes Intravenous 3 times per day 10/08/13 1823     10/07/13 0600  vancomycin (VANCOCIN) IVPB 1000 mg/200 mL premix  Status:  Discontinued     1,000 mg 200 mL/hr over 60 Minutes Intravenous Every 12 hours 10/06/13 1513 10/08/13 1823   10/06/13 1600  vancomycin (VANCOCIN) 1,500 mg in sodium chloride 0.9 % 500 mL IVPB     1,500 mg 250 mL/hr over 120 Minutes Intravenous  Once 10/06/13 1513 10/06/13 1916   10/04/13 1000  piperacillin-tazobactam  (ZOSYN) IVPB 3.375 g     3.375 g 12.5 mL/hr over 240 Minutes Intravenous Every 8 hours 10/04/13 0900     09/30/13 1400  ceFAZolin (ANCEF) IVPB 1 g/50 mL premix     1 g 100 mL/hr over 30 Minutes Intravenous Every 6 hours 09/30/13 1315 10/01/13 0212   09/30/13 0608  ceFAZolin (ANCEF) IVPB 2 g/50 mL premix     2 g 100 mL/hr over 30 Minutes Intravenous On call to O.R. 09/30/13 5681 09/30/13 0755      Assessment/Plan: s/p Procedure(s): ENDOSCOPIC RETROGRADE CHOLANGIOPANCREATOGRAPHY (ERCP) (N/A) PNA - DC vanc Pulmonary toilet.  Incentive spirometer.  Bile leak - stent, Zosyn, colestid Diarrhea - resolved  Diet as tolerated.  ambulate   LOS: 10 days    Rosario Jacks., [facility] 10/10/2013

## 2013-10-10 NOTE — Plan of Care (Signed)
Problem: Phase II Progression Outcomes Goal: Progress activity as tolerated unless otherwise ordered Outcome: Completed/Met Date Met:  10/10/13 Ambulated in hallway once with staff and once with family today.

## 2013-10-10 NOTE — Progress Notes (Signed)
Addressed questions/concerns of pt and family via interpreter.  Pt reported that she had difficulty coughing up phlegm (described as "white," "not like I'm getting the flu").  She said, "After I ate breakfast, I coughed so hard it hurt my ribs."  Encouraged pt to continue using incentive spirometer, which she was able to demonstrate.  Reminded her to use this hourly.  Encouraged her to sit up in chair more frequently and advised her that doctor wanted her to walk in the halls at least 3x/day.  Told pt/family that if this failed to help her remove the phlegm, we would try different options.  Pt/family verbalized understanding/agreement.

## 2013-10-11 ENCOUNTER — Inpatient Hospital Stay (HOSPITAL_COMMUNITY): Payer: No Typology Code available for payment source

## 2013-10-11 LAB — CBC
HCT: 29.8 % — ABNORMAL LOW (ref 36.0–46.0)
Hemoglobin: 9.7 g/dL — ABNORMAL LOW (ref 12.0–15.0)
MCH: 29.8 pg (ref 26.0–34.0)
MCHC: 32.6 g/dL (ref 30.0–36.0)
MCV: 91.7 fL (ref 78.0–100.0)
PLATELETS: 531 10*3/uL — AB (ref 150–400)
RBC: 3.25 MIL/uL — ABNORMAL LOW (ref 3.87–5.11)
RDW: 16.2 % — ABNORMAL HIGH (ref 11.5–15.5)
WBC: 10.8 10*3/uL — AB (ref 4.0–10.5)

## 2013-10-11 NOTE — Progress Notes (Signed)
Patient ID: Deborah Trujillo, female   DOB: 18-Feb-1965, 48 y.o.   MRN: [id number] 5 Days Post-Op  Subjective: Continues to improve.  Drain output continues to be low.    Objective: Vital signs in last 24 hours: Temp:  [98.5 F (36.9 C)-99.1 F (37.3 C)] 98.5 F (36.9 C) (08/31 0547) Pulse Rate:  [72-78] 74 (08/31 0547) Resp:  [16-18] 18 (08/31 0547) BP: (99-113)/(54-70) 107/54 mmHg (08/31 0547) SpO2:  [92 %-96 %] 96 % (08/31 0547) Last BM Date: 10/10/13  Intake/Output from previous day: 08/30 0701 - 08/31 0700 In: 3060 [P.O.:960; I.V.:1950; IV Piggyback:150] Out: 1115 [Urine:1100; Drains:15] Intake/Output this shift:    General appearance: alert and cooperative Resp: breathing comfortably GI: soft, nt, wound c/d/i, JP min bilious  Lab Results:   Recent Labs  10/10/13 0508 10/11/13 0500  WBC 10.0 10.8*  HGB 9.2* 9.7*  HCT 27.7* 29.8*  PLT 527* 531*   BMET  Recent Labs  10/08/13 1700 10/09/13 0520  NA  --  142  K  --  3.5*  CL  --  107  CO2  --  24  GLUCOSE  --  104*  BUN  --  4*  CREATININE 0.55 0.63  CALCIUM  --  7.7*   PT/INR No results found for this basename: LABPROT, INR,  in the last 72 hours ABG No results found for this basename: PHART, PCO2, PO2, HCO3,  in the last 72 hours  Studies/Results: No results found.  Anti-infectives: Anti-infectives   Start     Dose/Rate Route Frequency Ordered Stop   10/08/13 1830  vancomycin (VANCOCIN) IVPB 1000 mg/200 mL premix  Status:  Discontinued     1,000 mg 200 mL/hr over 60 Minutes Intravenous 3 times per day 10/08/13 1823 10/10/13 0757   10/07/13 0600  vancomycin (VANCOCIN) IVPB 1000 mg/200 mL premix  Status:  Discontinued     1,000 mg 200 mL/hr over 60 Minutes Intravenous Every 12 hours 10/06/13 1513 10/08/13 1823   10/06/13 1600  vancomycin (VANCOCIN) 1,500 mg in sodium chloride 0.9 % 500 mL IVPB     1,500 mg 250 mL/hr over 120 Minutes Intravenous  Once 10/06/13 1513 10/06/13 1916   10/04/13  1000  piperacillin-tazobactam (ZOSYN) IVPB 3.375 g     3.375 g 12.5 mL/hr over 240 Minutes Intravenous Every 8 hours 10/04/13 0900     09/30/13 1400  ceFAZolin (ANCEF) IVPB 1 g/50 mL premix     1 g 100 mL/hr over 30 Minutes Intravenous Every 6 hours 09/30/13 1315 10/01/13 0212   09/30/13 0608  ceFAZolin (ANCEF) IVPB 2 g/50 mL premix     2 g 100 mL/hr over 30 Minutes Intravenous On call to O.R. 09/30/13 2924 09/30/13 0755      Assessment/Plan: s/p Procedure(s): ENDOSCOPIC RETROGRADE CHOLANGIOPANCREATOGRAPHY (ERCP) (N/A) PNA - DC vanc Pulmonary toilet.  Incentive spirometer.  Bile leak - stent, Zosyn, colestid Diarrhea - resolved  Diet as tolerated.  Ambulate Home soon.     LOS: 11 days    Trilby [address] 10/11/2013

## 2013-10-12 LAB — CBC
HCT: 31.3 % — ABNORMAL LOW (ref 36.0–46.0)
HEMOGLOBIN: 10.1 g/dL — AB (ref 12.0–15.0)
MCH: 30 pg (ref 26.0–34.0)
MCHC: 32.3 g/dL (ref 30.0–36.0)
MCV: 92.9 fL (ref 78.0–100.0)
Platelets: 570 10*3/uL — ABNORMAL HIGH (ref 150–400)
RBC: 3.37 MIL/uL — AB (ref 3.87–5.11)
RDW: 16.3 % — ABNORMAL HIGH (ref 11.5–15.5)
WBC: 11.2 10*3/uL — ABNORMAL HIGH (ref 4.0–10.5)

## 2013-10-12 MED ORDER — OXYCODONE HCL 5 MG PO TABS: 5.0000 mg | ORAL_TABLET | ORAL | Status: DC | PRN

## 2013-10-12 MED ORDER — COLESTIPOL HCL 1 G PO TABS: 2.0000 g | ORAL_TABLET | Freq: Three times a day (TID) | ORAL | Status: DC

## 2013-10-12 MED ORDER — SULFAMETHOXAZOLE-TRIMETHOPRIM 800-160 MG PO TABS: 1.0000 | ORAL_TABLET | Freq: Two times a day (BID) | ORAL | Status: DC

## 2013-10-12 NOTE — Discharge Instructions (Signed)
Measure and record drain output twice daily.   Dieta rica en protenas (High Protein Diet) Una dieta rica en protenas es una dieta a la que se incorporan alimentos con un nivel elevado de protenas. Es importante incorporar ms protenas en la dieta por una variedad de motivos. La protena contribuye a que el cuerpo genere tejidos, msculos y repare daos. Es posible que las personas que se sometieron a Event organiser, lesiones como fractura de [place], infecciones y [place], o enfermedades como el cncer, necesiten ms protena en su dieta.  TAMAO DE LAS PORCIONES: La medicin de los alimentos y el tamao de las porciones lo ayudar a Aeronautical engineer cantidad exacta de comida que debe ingerir. La lista que sigue le mostrar el tamao de algunas porciones comunes.   1 onza (28 gr)..........4 dados apilados.  3 onzas (85 gr) .Marland KitchenMarland KitchenMarland KitchenMarland Kitchen1 mazo de cartas.  1 cucharadita.......... la punta del dedo [place].  1 cucharada.............1 pulgar.  2 cucharadas...........una pelota de golf.   taza      ..la mitad de un puo.  1 taza       un puo. ALIMENTOS QUE SON FUENTE DE PROTENA A continuacin encontrar una lista de algunas alimentos que son fuente de protenas y la cantidad de protena que contienen. Su nutricionista matriculado podr calcular cuntos gramos de protena necesita para su enfermedad. Los alimentos ricos en protenas se pueden incorporar a Mudlogger las comidas o como bocadillos. Asegrese de tener al menos un alimento con protena en cada comida y como bocadillo para garantizar una ingesta adecuada.  Carnes y sustitutos de la carne/protena (g)  3oz (85g) carne de ave (pollo, pavo)/26g  3oz (85g) de atn, enlatado en agua/26g  3oz (85g) de pescado (bacalao)/21g  3oz (85g) de carne roja (vaca, cerdo)/21g  4oz (113g) de tofu/9g  1 huevo/6g   de taza de sustituto de huevo/5g  1 taza de frijoles secos/15g  1 taza de leche de soja/4g Lcteos/protena  (g)  1 taza de leche (descremada, 1% o 2% entera)/ 8g   taza de leche evaporada/9g  1 taza de suero de leche/8g  1 taza de yogur natural, bajo en grasa/11g  1 taza de yogur natural entero/9g   taza de queso cottage/14g  1oz (28g) de queso cheddar/7g Nueces/protena (g)  2 cucharadas de mantequilla de man/8g  1oz (28g) de man/7g  2 cucharadas de castaas de caj/5g  2 cucharadas de almendras/5g Document Released: 11/18/2012 [facility] Patient Information 2015 [place], [place]. This information is not intended to replace advice given to you by your health care provider. Make sure you discuss any questions you have with your health care provider.

## 2013-10-12 NOTE — Discharge Summary (Signed)
Physician Discharge Summary  Patient ID: Deborah Trujillo MRN: [id number] DOB/AGE: 1965-12-27 48 y.o.  Admit date: 09/30/2013 Discharge date: 10/15/2013  Admission Diagnoses: Patient Active Problem List   Diagnosis Date Noted  . Hemangioma of liver s/p partial hepatectomy 09/30/2013 09/30/2013  . Pancreas cyst 07/26/2013  . RUQ abdominal pain 07/21/2012  . Other malaise and fatigue 07/21/2012    Discharge Diagnoses:  Principal Problem:   Hemangioma of liver s/p partial hepatectomy 09/30/2013 Active Problems:   RUQ abdominal pain Bile leak  Discharged Condition: stable  Hospital Course:  Pt was admitted to the ICU following a enucleation of right hepatic hemangioma.  She had some low urine output overnight.  She was bolused and had improvement.  She also had some tachycardia which improved with boluses.  Initially, her drain output was serosanguinous, but it became bilious on POD 3-4.  She was placed on antibiotics.  Over several days, the output went from 100/200 mL to 1400 mL.  GI was consulted, and an ERCP was performed with a stent.  The drain output went down immediately overnight.  She continued to have decreased appetite.  She improved over several days and was discharged to home in stable condition.  She was eating, ambulating independently, and taking oral antibiotics and oral narcotics.    Consults: GI  Significant Diagnostic Studies: labs: T Bili down to 0.6 prior to d/c.  WBCs 11.2, HCT 31.3.    Treatments: antibiotics: vancomycin and Zosyn, procedures: ERCP with biliary stent placement and surgery: see above.    Discharge Exam: Blood pressure 96/54, pulse 70, temperature 98.2 F (36.8 C), temperature source Oral, resp. rate 16, height 4\' 8"  (1.422 m), weight 169 lb 12.1 oz (77 kg), SpO2 91.00%. General appearance: alert, cooperative and no distress Resp: breathing comfortably Cardio: regular rate and rhythm GI: soft, non distended, approp tender, drain output  bilious.  Disposition: Home.    Medication List         colestipol 1 G tablet  Commonly known as:  COLESTID  Take 2 tablets (2 g total) by mouth 3 (three) times daily.     oxyCODONE 5 MG immediate release tablet  Commonly known as:  Oxy IR/ROXICODONE  Take 1-2 tablets (5-10 mg total) by mouth every 4 (four) hours as needed for moderate pain, severe pain or breakthrough pain.     sulfamethoxazole-trimethoprim 800-160 MG per tablet  Commonly known as:  BACTRIM DS,SEPTRA DS  Take 1 tablet by mouth 2 (two) times daily.       Follow-up Information   Follow up with [facility], MD In 2 weeks.   Specialty:  General Surgery   Contact information:   [address] [place] [address] [phone]       Signed: Stark Klein 10/15/2013, 7:40 AM

## 2013-10-12 NOTE — Progress Notes (Signed)
Nurse reviewed discharge instructions with pt and family using a spanish interpreter. Pt verbalized understanding of discharge instructions, drain care, new medications and follow up appointments.  No concerns at time of dishcarge.

## 2013-10-17 ENCOUNTER — Emergency Department (HOSPITAL_COMMUNITY): Payer: Self-pay

## 2013-10-17 ENCOUNTER — Inpatient Hospital Stay (HOSPITAL_COMMUNITY)
Admission: EM | Admit: 2013-10-17 | Discharge: 2013-10-21 | DRG: 395 | Disposition: A | Discharge status: Home / Self Care | Payer: Self-pay | Attending: General Surgery | Admitting: General Surgery

## 2013-10-17 ENCOUNTER — Other Ambulatory Visit (INDEPENDENT_AMBULATORY_CARE_PROVIDER_SITE_OTHER): Payer: Self-pay | Admitting: General Surgery

## 2013-10-17 ENCOUNTER — Encounter (HOSPITAL_COMMUNITY): Payer: Self-pay | Admitting: Emergency Medicine

## 2013-10-17 DIAGNOSIS — K9189 Other postprocedural complications and disorders of digestive system: Secondary | ICD-10-CM | POA: Diagnosis present

## 2013-10-17 DIAGNOSIS — K838 Other specified diseases of biliary tract: Secondary | ICD-10-CM | POA: Diagnosis present

## 2013-10-17 DIAGNOSIS — R1011 Right upper quadrant pain: Secondary | ICD-10-CM

## 2013-10-17 DIAGNOSIS — K929 Disease of digestive system, unspecified: Principal | ICD-10-CM | POA: Diagnosis present

## 2013-10-17 DIAGNOSIS — Y831 Surgical operation with implant of artificial internal device as the cause of abnormal reaction of the patient, or of later complication, without mention of misadventure at the time of the procedure: Secondary | ICD-10-CM | POA: Diagnosis present

## 2013-10-17 DIAGNOSIS — Z79899 Other long term (current) drug therapy: Secondary | ICD-10-CM

## 2013-10-17 DIAGNOSIS — E785 Hyperlipidemia, unspecified: Secondary | ICD-10-CM | POA: Diagnosis present

## 2013-10-17 LAB — COMPREHENSIVE METABOLIC PANEL
ALT: 34 U/L (ref 0–35)
AST: 18 U/L (ref 0–37)
Albumin: 2.8 g/dL — ABNORMAL LOW (ref 3.5–5.2)
Alkaline Phosphatase: 119 U/L — ABNORMAL HIGH (ref 39–117)
Anion gap: 14 (ref 5–15)
BUN: 11 mg/dL (ref 6–23)
CALCIUM: 9.2 mg/dL (ref 8.4–10.5)
CO2: 23 mEq/L (ref 19–32)
Chloride: 105 mEq/L (ref 96–112)
Creatinine, Ser: 0.61 mg/dL (ref 0.50–1.10)
GFR calc non Af Amer: >90 mL/min (ref >90)
GLUCOSE: 99 mg/dL (ref 70–99)
Potassium: 4.1 mEq/L (ref 3.7–5.3)
SODIUM: 142 meq/L (ref 137–147)
TOTAL PROTEIN: 7 g/dL (ref 6.0–8.3)
Total Bilirubin: 0.5 mg/dL (ref 0.3–1.2)

## 2013-10-17 LAB — CBC
HCT: 34.5 % — ABNORMAL LOW (ref 36.0–46.0)
HEMOGLOBIN: 11.2 g/dL — AB (ref 12.0–15.0)
MCH: 29.1 pg (ref 26.0–34.0)
MCHC: 32.5 g/dL (ref 30.0–36.0)
MCV: 89.6 fL (ref 78.0–100.0)
PLATELETS: 556 10*3/uL — AB (ref 150–400)
RBC: 3.85 MIL/uL — ABNORMAL LOW (ref 3.87–5.11)
RDW: 15.2 % (ref 11.5–15.5)
WBC: 9.7 10*3/uL (ref 4.0–10.5)

## 2013-10-17 LAB — MAGNESIUM: Magnesium: 2 mg/dL (ref 1.5–2.5)

## 2013-10-17 MED ORDER — OXYCODONE HCL 5 MG PO TABS
5.0000 mg | ORAL_TABLET | ORAL | Status: DC | PRN
  Administered 2013-10-17 – 2013-10-20 (×6): 5 mg via ORAL
  Administered 2013-10-21: 10 mg via ORAL
  Filled 2013-10-17 (×2): qty 1
  Filled 2013-10-17: qty 2
  Filled 2013-10-17 (×4): qty 1

## 2013-10-17 MED ORDER — PANTOPRAZOLE SODIUM 40 MG IV SOLR
40.0000 mg | Freq: Every day | INTRAVENOUS | Status: DC
  Administered 2013-10-17 – 2013-10-19 (×3): 40 mg via INTRAVENOUS
  Filled 2013-10-17 (×4): qty 40

## 2013-10-17 MED ORDER — KCL-LACTATED RINGERS-D5W 20 MEQ/L IV SOLN
INTRAVENOUS | Status: DC
  Administered 2013-10-17 – 2013-10-19 (×4): via INTRAVENOUS
  Filled 2013-10-17 (×9): qty 1000

## 2013-10-17 MED ORDER — TECHNETIUM TC 99M MEBROFENIN IV KIT
5.3000 | PACK | Freq: Once | INTRAVENOUS | Status: AC | PRN
  Administered 2013-10-17: 5.3 via INTRAVENOUS

## 2013-10-17 MED ORDER — MAGNESIUM HYDROXIDE 400 MG/5ML PO SUSP
30.0000 mL | Freq: Every day | ORAL | Status: DC
  Administered 2013-10-17 – 2013-10-21 (×4): 30 mL via ORAL
  Filled 2013-10-17 (×4): qty 30

## 2013-10-17 MED ORDER — PANTOPRAZOLE SODIUM 40 MG PO TBEC: 40.0000 mg | DELAYED_RELEASE_TABLET | Freq: Every day | ORAL | Status: DC

## 2013-10-17 NOTE — ED Notes (Signed)
Pt sent by surgeon to be admitted.  Dr. Barkley Bruns to see pt in ER.  States she had liver surgery on 8/20.  Since then has been having abd pain.  Pt speaks spanish.  Family member is helpful with history.  No vomiting or diarrhea.  Pt appears unwell.

## 2013-10-17 NOTE — ED Notes (Signed)
She states she is here because her right abd. J-P drain is draining more recently than is has for the past few days.  She states she is eating and drinking normal amounts.  Her skin is slightly pale, warm and dry and she is breathing normally.

## 2013-10-17 NOTE — ED Provider Notes (Signed)
Sent by surgeon for admission. Dr. Barkley Bruns to see pt in ER. Liver surgery Aug 20, with abd pain since. PT returned due to "increased drainage from liver surgery site" Pt vitals stable and afebrile in ED.Pt NAD, resting in ED     Verl Dicker, PA-C 10/17/13 [phone]

## 2013-10-17 NOTE — ED Notes (Signed)
I have just given report to [place], RN on [address] and will transport shortly.

## 2013-10-17 NOTE — Consult Note (Signed)
Subjective:   HPI  The patient is a 48 year old female who underwent a partial hepatectomy last month for a benign hemangioma. She developed a bile leak after this. She then had an ERCP with biliary stent placement by Dr. Paulita Fujita on August 26. She then went home and was doing well with decrease in amounts of bile coming out of her drain until earlier today when the family called Dr. Zella Richer and told him that there was a lot of drainage and they were having to empty the bulb 3 times. She came to the emergency room where she had a biliary scan and that shows evidence of a contained bile leak around the gallbladder fossa. It also appears that the previously placed biliary stent might have migrated into the duodenum.  Review of Systems Denies chest pain or shortness of breath  Past Medical History  Diagnosis Date  . Liver mass   . Hyperlipidemia   . Pain     PAIN - BURNING SENSATION RIGHT SIDE - BLOOD WORK ABNORMAL - FOUND TO HAVE LIVER MASS  . Colitis    Past Surgical History  Procedure Laterality Date  . Neck mass excision  2009    absess removed   . Tubal ligation    . Laparoscopic partial hepatectomy Right 09/30/2013    Procedure: PARTIAL RIGHT HEPATECTOMY; CHOLECYSTECTOMY;  Surgeon: Stark Klein, MD;  Location: [facility];  Service: General;  Laterality: Right;  . Ercp N/A 10/06/2013    Procedure: ENDOSCOPIC RETROGRADE CHOLANGIOPANCREATOGRAPHY (ERCP);  Surgeon: Arta Silence, MD;  Location: [facility];  Service: Endoscopy;  Laterality: N/A;   History   Social History  . Marital Status: Single    Spouse Name: N/A    Number of Children: N/A  . Years of Education: N/A   Occupational History  . Not on file.   Social History Main Topics  . Smoking status: Never Smoker   . Smokeless tobacco: Never Used  . Alcohol Use: No  . Drug Use: No  . Sexual Activity: Not on file   Other Topics Concern  . Not on file   Social History Narrative  . No narrative on file   family history  includes Diabetes in her mother. Current facility-administered medications:dextrose 5% in lactated ringers with KCl 20 mEq/L infusion, , Intravenous, Continuous, Jackolyn Confer, MD;  magnesium hydroxide (MILK OF MAGNESIA) suspension 30 mL, 30 mL, Oral, Daily, Jackolyn Confer, MD;  oxyCODONE (Oxy IR/ROXICODONE) immediate release tablet 5-10 mg, 5-10 mg, Oral, Q4H PRN, Jackolyn Confer, MD;  pantoprazole (PROTONIX) injection 40 mg, 40 mg, Intravenous, QHS, Jackolyn Confer, MD No Known Allergies   Objective:     BP 100/65  Pulse 74  Temp(Src) 98.8 F (37.1 C) (Oral)  Resp 18  Wt 66.497 kg (146 lb 9.6 oz)  SpO2 96%  She is in no distress  Nonicteric  Heart regular rhythm no murmurs  Lungs clear  Abdomen: Bowel sounds present, soft, nontender. There is a bile drain on the right side of her abdomen.  Laboratory No components found with this basename: d1      Assessment:     48 year old female status post partial hepatectomy with bile leak. It appears that the stent has migrated perhaps into the duodenum. There was an excessive amount of biliary drainage in the bulb today. Nuclear medicine scan shows bile leak.      Plan:     Case discussed with Dr. Zella Richer.. We will plan ERCP with stent exchange. I explained the procedure in detail  to the patient and her son. The son speaks [place]. The patient does not. I explained the risks of bleeding, infection, perforation, and pancreatitis. They understand. They understand it is the same test as Dr. Paulita Fujita did previously. They are in agreement.    Component Value Date/Time   WBC 9.7 10/17/2013 1518   HGB 11.2* 10/17/2013 1518   HCT 34.5* 10/17/2013 1518   PLT 556* 10/17/2013 1518   ALT 106* 10/09/2013 0520   AST 44* 10/09/2013 0520   NA 142 10/09/2013 0520   K 3.5* 10/09/2013 0520   CL 107 10/09/2013 0520   CREATININE 0.63 10/09/2013 0520   CREATININE 0.88 07/21/2012 1357   BUN 4* 10/09/2013 0520   CO2 24 10/09/2013 0520   CALCIUM 7.7*  10/09/2013 0520   ALKPHOS 178* 10/09/2013 0520    Lab Results  Component Value Date   HGB 11.2* 10/17/2013   HGB 10.1* 10/12/2013   HGB 9.7* 10/11/2013   HCT 34.5* 10/17/2013   HCT 31.3* 10/12/2013   HCT 29.8* 10/11/2013   ALKPHOS 178* 10/09/2013   ALKPHOS 49 10/04/2013   ALKPHOS 43 09/27/2013   AST 44* 10/09/2013   AST 63* 10/04/2013   AST 16 09/27/2013   ALT 106* 10/09/2013   ALT 256* 10/04/2013   ALT 16 09/27/2013

## 2013-10-17 NOTE — ED Provider Notes (Signed)
Medical screening examination/treatment/procedure(s) were performed by non-physician practitioner and as supervising physician I was immediately available for consultation/collaboration.   EKG Interpretation None        Malvin Johns, MD 10/17/13 1115

## 2013-10-17 NOTE — ED Notes (Signed)
She is awake, alert and in no distress.

## 2013-10-17 NOTE — ED Notes (Signed)
She is taken to Nuclear med. At this time.

## 2013-10-17 NOTE — H&P (Signed)
Deborah Trujillo is an 48 y.o. female.   Chief Complaint: increased drain output  HPI: She underwent partial hepatectomy for hemangioma of the liver 09/30/2013. Postoperatively she, she had a bile leak. The ERCP and stent placement was performed at Dr. Paulita Fujita on August 26. The drain output went from 1400 mL back down to 50 - 100 mL after the procedure. She subsequently was discharged to home on September 1.  Since 4:00 this morning, the drain has been emptied 4 times. It is filling the bulb every hour with bilious fluid. I spoke with her family member over the phone and told her to bring her to the emergency room for admission.  Family member is here to translate. She denies any fever, nausea, vomiting, diarrhea. Does have some pain around the drain site but no diffuse abdominal pain. She has been eating.  Past Medical History  Diagnosis Date  . Liver mass   . Hyperlipidemia   . Pain     PAIN - BURNING SENSATION RIGHT SIDE - BLOOD WORK ABNORMAL - FOUND TO HAVE LIVER MASS  . Colitis     Past Surgical History  Procedure Laterality Date  . Neck mass excision  2009    absess removed   . Tubal ligation    . Laparoscopic partial hepatectomy Right 09/30/2013    Procedure: PARTIAL RIGHT HEPATECTOMY; CHOLECYSTECTOMY;  Surgeon: Stark Klein, MD;  Location: [facility];  Service: General;  Laterality: Right;  . Ercp N/A 10/06/2013    Procedure: ENDOSCOPIC RETROGRADE CHOLANGIOPANCREATOGRAPHY (ERCP);  Surgeon: Arta Silence, MD;  Location: [facility];  Service: Endoscopy;  Laterality: N/A;    Family History  Problem Relation Age of Onset  . Diabetes Mother    Social History:  reports that she has never smoked. She has never used smokeless tobacco. She reports that she does not drink alcohol or use illicit drugs.  Allergies: No Known Allergies  Prior to Admission medications   Medication Sig Start Date End Date Taking? Authorizing Provider  colestipol (COLESTID) 1 G tablet Take 2 tablets (2 g  total) by mouth 3 (three) times daily. 10/12/13  Yes Stark Klein, MD  oxyCODONE (OXY IR/ROXICODONE) 5 MG immediate release tablet Take 1-2 tablets (5-10 mg total) by mouth every 4 (four) hours as needed for moderate pain, severe pain or breakthrough pain. 10/12/13  Yes Stark Klein, MD  sulfamethoxazole-trimethoprim (BACTRIM DS,SEPTRA DS) 800-160 MG per tablet Take 1 tablet by mouth 2 (two) times daily. 10/12/13  Yes Stark Klein, MD      (Not in a hospital admission)  No results found for this or any previous visit (from the past 48 hour(s)). No results found.  Review of Systems  Constitutional: Negative for fever and chills.  Gastrointestinal: Negative for nausea, vomiting and diarrhea.    Blood pressure 120/74, pulse 76, temperature 98.4 F (36.9 C), temperature source Oral, resp. rate 18, SpO2 100.00%. Physical Exam  Constitutional: She appears well-developed and well-nourished. No distress.  HENT:  Head: Normocephalic and atraumatic.  Cardiovascular: Normal rate and regular rhythm.   Respiratory: Effort normal and breath sounds normal.  GI: Soft. She exhibits no distension and no mass. There is tenderness (Around drain site).  The right upper quadrant incision is clean and intact. Drain output is bilious.  Lymphadenopathy:    She has no cervical adenopathy.     Assessment/Plan Post partial hepatectomy bile leak. Drainage was under control following ERCP with stent placement. Drainage has acutely increased. This is suspicious for a stent displacement  or occlusion.  Plan: Admit to hospital. Check abdominal x-ray. Check HIDA scan.  GI consult if stent appears occluded.  Modupe Shampine J 10/17/2013, 9:35 AM

## 2013-10-18 ENCOUNTER — Inpatient Hospital Stay (HOSPITAL_COMMUNITY): Payer: No Typology Code available for payment source

## 2013-10-18 ENCOUNTER — Encounter (HOSPITAL_COMMUNITY): Payer: Self-pay

## 2013-10-18 ENCOUNTER — Inpatient Hospital Stay (HOSPITAL_COMMUNITY): Payer: Self-pay | Admitting: Anesthesiology

## 2013-10-18 ENCOUNTER — Encounter (HOSPITAL_COMMUNITY): Payer: Self-pay | Admitting: Anesthesiology

## 2013-10-18 ENCOUNTER — Encounter (HOSPITAL_COMMUNITY)
Admission: EM | Disposition: A | Discharge status: Home / Self Care | Payer: Self-pay | Source: Home / Self Care | Attending: General Surgery

## 2013-10-18 HISTORY — PX: ERCP: SHX5425

## 2013-10-18 SURGERY — ERCP, WITH INTERVENTION IF INDICATED
Anesthesia: Moderate Sedation

## 2013-10-18 SURGERY — ERCP, WITH INTERVENTION IF INDICATED
Anesthesia: General

## 2013-10-18 MED ORDER — ONDANSETRON HCL 4 MG/2ML IJ SOLN
INTRAMUSCULAR | Status: DC | PRN
Start: 2013-10-18 — End: 2013-10-18
  Administered 2013-10-18: 4 mg via INTRAVENOUS

## 2013-10-18 MED ORDER — FENTANYL CITRATE 0.05 MG/ML IJ SOLN
INTRAMUSCULAR | Status: AC
  Filled 2013-10-18: qty 2

## 2013-10-18 MED ORDER — LIDOCAINE HCL (CARDIAC) 20 MG/ML IV SOLN
INTRAVENOUS | Status: AC
  Filled 2013-10-18: qty 5

## 2013-10-18 MED ORDER — MIDAZOLAM HCL 2 MG/2ML IJ SOLN
INTRAMUSCULAR | Status: AC
  Filled 2013-10-18: qty 2

## 2013-10-18 MED ORDER — LACTATED RINGERS IV SOLN
INTRAVENOUS | Status: DC | PRN
  Administered 2013-10-18: 14:00:00 via INTRAVENOUS

## 2013-10-18 MED ORDER — LACTATED RINGERS IV SOLN: INTRAVENOUS | Status: DC | PRN

## 2013-10-18 MED ORDER — PROPOFOL 10 MG/ML IV BOLUS
INTRAVENOUS | Status: DC | PRN
  Administered 2013-10-18: 150 mg via INTRAVENOUS

## 2013-10-18 MED ORDER — PROMETHAZINE HCL 25 MG/ML IJ SOLN: 6.2500 mg | INTRAMUSCULAR | Status: DC | PRN

## 2013-10-18 MED ORDER — SODIUM CHLORIDE 0.9 % IV SOLN
INTRAVENOUS | Status: DC
  Administered 2013-10-18: 17:00:00 via INTRAVENOUS

## 2013-10-18 MED ORDER — SUCCINYLCHOLINE CHLORIDE 20 MG/ML IJ SOLN
INTRAMUSCULAR | Status: DC | PRN
  Administered 2013-10-18: 100 mg via INTRAVENOUS

## 2013-10-18 MED ORDER — DEXAMETHASONE SODIUM PHOSPHATE 10 MG/ML IJ SOLN
INTRAMUSCULAR | Status: DC | PRN
  Administered 2013-10-18: 10 mg via INTRAVENOUS

## 2013-10-18 MED ORDER — SODIUM CHLORIDE 0.9 % IV SOLN: INTRAVENOUS | Status: DC

## 2013-10-18 MED ORDER — LIDOCAINE HCL (CARDIAC) 20 MG/ML IV SOLN
INTRAVENOUS | Status: DC | PRN
  Administered 2013-10-18: 100 mg via INTRAVENOUS

## 2013-10-18 MED ORDER — PIPERACILLIN-TAZOBACTAM 3.375 G IVPB
3.3750 g | Freq: Three times a day (TID) | INTRAVENOUS | Status: DC
  Administered 2013-10-18 – 2013-10-20 (×7): 3.375 g via INTRAVENOUS
  Filled 2013-10-18 (×8): qty 50

## 2013-10-18 MED ORDER — MIDAZOLAM HCL 5 MG/5ML IJ SOLN
INTRAMUSCULAR | Status: DC | PRN
  Administered 2013-10-18: 2 mg via INTRAVENOUS

## 2013-10-18 MED ORDER — MEPERIDINE HCL 50 MG/ML IJ SOLN: 6.2500 mg | INTRAMUSCULAR | Status: DC | PRN

## 2013-10-18 MED ORDER — PROPOFOL 10 MG/ML IV BOLUS
INTRAVENOUS | Status: AC
  Filled 2013-10-18: qty 20

## 2013-10-18 MED ORDER — IOHEXOL 300 MG/ML  SOLN
INTRAMUSCULAR | Status: DC | PRN
  Administered 2013-10-18: 14:00:00

## 2013-10-18 NOTE — Anesthesia Preprocedure Evaluation (Addendum)
Anesthesia Evaluation  Patient identified by MRN, date of birth, ID band Patient awake    Reviewed: Allergy & Precautions, H&P , NPO status , Patient's Chart, lab work & pertinent test results  Airway Mallampati: II TM Distance: >3 FB Neck ROM: Full    Dental no notable dental hx.    Pulmonary neg pulmonary ROS,  breath sounds clear to auscultation  Pulmonary exam normal       Cardiovascular negative cardio ROS  Rhythm:Regular Rate:Normal     Neuro/Psych negative neurological ROS  negative psych ROS   GI/Hepatic negative GI ROS, Neg liver ROS,   Endo/Other  negative endocrine ROS  Renal/GU negative Renal ROS     Musculoskeletal negative musculoskeletal ROS (+)   Abdominal   Peds  Hematology negative hematology ROS (+)   Anesthesia Other Findings   Reproductive/Obstetrics negative OB ROS                           Anesthesia Physical  Anesthesia Plan  ASA: II  Anesthesia Plan: General   Post-op Pain Management:    Induction: Intravenous  Airway Management Planned: Oral ETT  Additional Equipment:   Intra-op Plan:   Post-operative Plan: Extubation in OR  Informed Consent: I have reviewed the patients History and Physical, chart, labs and discussed the procedure including the risks, benefits and alternatives for the proposed anesthesia with the patient or authorized representative who has indicated his/her understanding and acceptance.   Dental advisory given  Plan Discussed with: CRNA  Anesthesia Plan Comments:        Anesthesia Quick Evaluation

## 2013-10-18 NOTE — Transfer of Care (Signed)
Immediate Anesthesia Transfer of Care Note  Patient: Deborah Trujillo  Procedure(s) Performed: Procedure(s): ENDOSCOPIC RETROGRADE CHOLANGIOPANCREATOGRAPHY (ERCP) (N/A)  Patient Location: PACU  Anesthesia Type:General  Level of Consciousness: sedated  Airway & Oxygen Therapy: Patient Spontanous Breathing and Patient connected to face mask oxygen  Post-op Assessment: Report given to PACU RN and Post -op Vital signs reviewed and stable  Post vital signs: Reviewed and stable  Complications: No apparent anesthesia complications

## 2013-10-18 NOTE — Op Note (Signed)
[facility] [place], [address]   ERCP PROCEDURE REPORT  PATIENT: Deborah Trujillo, Deborah Trujillo  MR# :[id number] BIRTHDATE: October 23, 1965  GENDER: Female ENDOSCOPIST: Clarene Essex, MD REFERRED BY: Stark Klein, M.D. PROCEDURE DATE:  10/18/2013 PROCEDURE:   ERCP with stent removal and placement  of larger and longer 1 ASA CLASS:    2 INDICATIONS: bile leak MEDICATIONS:    general anesthesia TOPICAL ANESTHETIC:  none  DESCRIPTION OF PROCEDURE:   After the risks benefits and alternatives of the procedure were thoroughly explained, informed consent was obtained.  The ercp pentax [id number]  endoscope was introduced through the mouth and advanced to the second portion of the duodenum .the previously placed stent was brought into view and it was slightly further out in the duodenal and although there was some debris in it there seemed to be some occasional drips of bile but we removed it with the snare and then we advanced a triple lumen sphincterotome and deep selective cannulation was obtained easily on the first attempt and dye was injected and we believe we saw a leak in the left side and we went ahead and advance the wire into the left system and advanced to the 10 French 9 cm stent in the customary fashion until we had adequate biliary drainage and the proper position was confirmed under fluoroscopy and endoscopically and the wire and introducer were removed and the scope was removed and the patient tolerated the procedure well there was no obvious immediate complication and there was no pancreatic duct wire advancement or injection throughout the procedure          COMPLICATIONS: none  ENDOSCOPIC IMPRESSION:1. Normal ampulla status post small previous sphincterotomy status post stent removal as above and replacement as above using the 10 French 9 cm stent 2 otherwise no obvious findings and no PD injections or wire  advancement  RECOMMENDATIONS:customary post-ERCP orders and either Dr. Paulita Fujita myself or Dr. Penelope Coop because he speaks Spanish better happy to see back when necessary     _______________________________ eSigned:  Clarene Essex, MD 10/18/2013 2:43 PM   FF:MBWGY Barry Dienes, MD

## 2013-10-18 NOTE — Care Management Note (Signed)
    Page 1 of 1   10/18/2013     2:31:47 PM CARE MANAGEMENT NOTE 10/18/2013  Patient:  Deborah Trujillo, Deborah Trujillo   Account Number:  [id number]  Date Initiated:  10/18/2013  Documentation initiated by:  Dessa Phi  Subjective/Objective Assessment:   48 Y/O F ADMITTED W/BILE LEAK.READMIT 8/21-9/1-HEPATECTOMY.     Action/Plan:   FROM HOME.   Anticipated DC Date:  10/21/2013   Anticipated DC Plan:  [place]  CM consult      Choice offered to / List presented to:             Status of service:  In process, will continue to follow Medicare Important Message given?   (If response is "NO", the following Medicare IM given date fields will be blank) Date Medicare IM given:   Medicare IM given by:   Date Additional Medicare IM given:   Additional Medicare IM given by:    Discharge Disposition:    Per UR Regulation:  Reviewed for med. necessity/level of care/duration of stay  If discussed at [place] of Stay Meetings, dates discussed:    Comments:  10/18/13 Penda Venturi RN,BSN NCM [employer].IV ABX.NO ANTICIPATED D/C NEEDS.

## 2013-10-18 NOTE — Anesthesia Postprocedure Evaluation (Signed)
Anesthesia Post Note  Patient: Deborah Trujillo  Procedure(s) Performed: Procedure(s) (LRB): ENDOSCOPIC RETROGRADE CHOLANGIOPANCREATOGRAPHY (ERCP) (N/A)  Anesthesia type: General  Patient location: PACU  Post pain: Pain level controlled  Post assessment: Post-op Vital signs reviewed  Last Vitals: BP 115/63  Pulse 53  Temp(Src) 36.9 C (Oral)  Resp 12  Ht 4\' 8"  (1.422 m)  Wt 146 lb 9.6 oz (66.497 kg)  BMI 32.89 kg/m2  SpO2 95%  Post vital signs: Reviewed  Level of consciousness: sedated  Complications: No apparent anesthesia complications

## 2013-10-18 NOTE — Progress Notes (Signed)
Subjective: Still with pain around drain site.  Objective: Vital signs in last 24 hours: Temp:  [98.3 F (36.8 C)-98.9 F (37.2 C)] 98.6 F (37 C) (09/07 4034) Pulse Rate:  [66-74] 73 (09/07 0608) Resp:  [16-18] 16 (09/07 0608) BP: (91-105)/(50-65) 91/50 mmHg (09/07 0608) SpO2:  [95 %-98 %] 95 % (09/07 0608) Weight:  [146 lb 9.6 oz (66.497 kg)] 146 lb 9.6 oz (66.497 kg) (09/06 1443) Last BM Date: 10/17/13  Intake/Output from previous day: 09/06 0701 - 09/07 0700 In: 1715 [P.O.:240; I.V.:1475] Out: 75 [Urine:25; Drains:50] Intake/Output this shift: Total I/O In: -  Out: 5 [Drains:5]  PE: General- In NAD Abdomen-soft, bilious drain output, tender around drain site.  Lab Results:   Recent Labs  10/17/13 1518  WBC 9.7  HGB 11.2*  HCT 34.5*  PLT 556*   BMET  Recent Labs  10/17/13 1518  NA 142  K 4.1  CL 105  CO2 23  GLUCOSE 99  BUN 11  CREATININE 0.61  CALCIUM 9.2   PT/INR No results found for this basename: LABPROT, INR,  in the last 72 hours Comprehensive Metabolic Panel:    Component Value Date/Time   NA 142 10/17/2013 1518   NA 142 10/09/2013 0520   K 4.1 10/17/2013 1518   K 3.5* 10/09/2013 0520   CL 105 10/17/2013 1518   CL 107 10/09/2013 0520   CO2 23 10/17/2013 1518   CO2 24 10/09/2013 0520   BUN 11 10/17/2013 1518   BUN 4* 10/09/2013 0520   CREATININE 0.61 10/17/2013 1518   CREATININE 0.63 10/09/2013 0520   CREATININE 0.88 07/21/2012 1357   GLUCOSE 99 10/17/2013 1518   GLUCOSE 104* 10/09/2013 0520   CALCIUM 9.2 10/17/2013 1518   CALCIUM 7.7* 10/09/2013 0520   AST 18 10/17/2013 1518   AST 44* 10/09/2013 0520   ALT 34 10/17/2013 1518   ALT 106* 10/09/2013 0520   ALKPHOS 119* 10/17/2013 1518   ALKPHOS 178* 10/09/2013 0520   BILITOT 0.5 10/17/2013 1518   BILITOT 0.6 10/09/2013 0520   PROT 7.0 10/17/2013 1518   PROT 4.9* 10/09/2013 0520   ALBUMIN 2.8* 10/17/2013 1518   ALBUMIN 1.8* 10/09/2013 0520     Studies/Results: Nm Hepatobiliary Liver Func  10/17/2013    CLINICAL DATA:  Status post partial right lobe hepatectomy for large hemangioma on 09/30/2013. The gallbladder was also removed at that time for chronic cholecystitis. Postoperatively, bile leak was treated by endoscopic stent placement. There is new increased bile output from an indwelling surgical drain.  EXAM: NUCLEAR MEDICINE HEPATOBILIARY IMAGING  TECHNIQUE: Sequential images of the abdomen were obtained out to 60 minutes following intravenous administration of radiopharmaceutical.  RADIOPHARMACEUTICALS:  5.3 Millicurie VQ-25Z Choletec  COMPARISON:  Abdominal series earlier today as well as prior to ERCP images on 10/06/2013.  FINDINGS: There is initial normal activity within the liver with no delay in biliary excretion. After roughly 40 min, there is visualization of a collection of activity to the right of the expected position of the common bile duct which likely is near the gallbladder fossa. This activity stays in this region and does not spread around the liver, but is suspicious for contained bile leak. Based on the abdominal series, the endoscopic stent does appear to be positioned more inferiorly and medially compared to immediate imaging during ERCP and the stent may have migrated entirely into the duodenum.  IMPRESSION: The nuclear medicine study is positive for abnormal activity localizing to the region of the gallbladder fossa  and likely representing contained bile leak. As above, the previously placed endoscopic biliary stent appears to lie more inferiorly and medially compared to ERCP images and the stent may have migrated into the duodenum.  Findings were discussed with Dr. Zella Richer by telephone.   Electronically Signed   By: Aletta Edouard M.D.   On: 10/17/2013 13:45   Dg Abd Acute W/chest  10/17/2013   CLINICAL DATA:  Abdominal distension. History of partial hepatectomy.  EXAM: ACUTE ABDOMEN SERIES (ABDOMEN 2 VIEW & CHEST 1 VIEW)  COMPARISON:  Chest radiograph- 10/11/2013 ; 09/16/2013   FINDINGS: Grossly unchanged cardiac silhouette and mediastinal contours with obscuration of the right heart border secondary to unchanged small right-sided pleural effusion. Minimally improved aeration of lung bases with persistent bibasilar opacities, right greater than left. No new focal airspace opacities. No pneumothorax.  A surgical drain and multiple surgical clips overlies the right upper abdominal quadrant. The biliary stent overlies expected location of the common bile duct.  Moderate colonic stool burden without evidence of obstruction. No pneumoperitoneum, pneumatosis or portal venous gas.  No acute osseus abnormalities.  IMPRESSION: 1. Moderate colonic stool burden without evidence of obstruction. 2. Postsurgical change of the right upper abdominal quadrant compatible with provided history of partial hepatectomy. 3. A biliary stent overlies expected location of the CBD. 4. Unchanged small right-sided effusion with associated bibasilar opacities, right greater than left, likely atelectasis.   Electronically Signed   By: Sandi Mariscal M.D.   On: 10/17/2013 10:16    Anti-infectives: Anti-infectives   Start     Dose/Rate Route Frequency Ordered Stop   10/18/13 0800  piperacillin-tazobactam (ZOSYN) IVPB 3.375 g     3.375 g 12.5 mL/hr over 240 Minutes Intravenous Every 8 hours 10/18/13 0713        Assessment Active Problems:   Bile leak, postoperative-HIDA confirms the leak; x-ray suggests migration of stent; drain output down with restricted diet.    LOS: 1 day   Plan: ERCP today.   Avianah Pellman J 10/18/2013

## 2013-10-18 NOTE — Progress Notes (Signed)
Deborah Trujillo 1:14 PM  Subjective: Patient discussed with Dr. Paulita Fujita in Dr. Penelope Coop and chart reviewed including labs and x-rays in patient with a difficult to treat bile leak status post surgery Objective: Vital signs stable afebrile exam please see pre-assessment evaluation labs stable  Assessment: Bile leak status post surgery  Plan: Okay to proceed with repeat ERCP and stenting with anesthesia assistance  [facility]

## 2013-10-19 ENCOUNTER — Encounter (HOSPITAL_COMMUNITY): Payer: Self-pay | Admitting: Gastroenterology

## 2013-10-19 MED ORDER — POTASSIUM CHLORIDE 2 MEQ/ML IV SOLN
INTRAVENOUS | Status: DC
  Administered 2013-10-19: 14:00:00 via INTRAVENOUS
  Filled 2013-10-19 (×3): qty 1000

## 2013-10-19 NOTE — Progress Notes (Signed)
Patient ID: Deborah Trujillo, female   DOB: 1965-09-26, 48 y.o.   MRN: [id number] 1 Day Post-Op  Subjective: Still with pain around drain site.  Abd feeling improved since repeat ERCP.    Objective: Vital signs in last 24 hours: Temp:  [97.9 F (36.6 C)-98.5 F (36.9 C)] 97.9 F (36.6 C) (09/08 0600) Pulse Rate:  [51-81] 63 (09/08 0600) Resp:  [10-17] 16 (09/08 0600) BP: (95-115)/(48-74) 95/57 mmHg (09/08 0600) SpO2:  [94 %-100 %] 95 % (09/08 0600) Last BM Date: 10/17/13  Intake/Output from previous day: 09/07 0701 - 09/08 0700 In: 3248 [I.V.:3170; IV Piggyback:78] Out: 280 [Drains:280] Intake/Output this shift:    PE: General- In NAD Abdomen-soft, bilious drain output, tender around drain site, but no evidence of infection.  Lab Results:   Recent Labs  10/17/13 1518  WBC 9.7  HGB 11.2*  HCT 34.5*  PLT 556*   BMET  Recent Labs  10/17/13 1518  NA 142  K 4.1  CL 105  CO2 23  GLUCOSE 99  BUN 11  CREATININE 0.61  CALCIUM 9.2   PT/INR No results found for this basename: LABPROT, INR,  in the last 72 hours Comprehensive Metabolic Panel:    Component Value Date/Time   NA 142 10/17/2013 1518   NA 142 10/09/2013 0520   K 4.1 10/17/2013 1518   K 3.5* 10/09/2013 0520   CL 105 10/17/2013 1518   CL 107 10/09/2013 0520   CO2 23 10/17/2013 1518   CO2 24 10/09/2013 0520   BUN 11 10/17/2013 1518   BUN 4* 10/09/2013 0520   CREATININE 0.61 10/17/2013 1518   CREATININE 0.63 10/09/2013 0520   CREATININE 0.88 07/21/2012 1357   GLUCOSE 99 10/17/2013 1518   GLUCOSE 104* 10/09/2013 0520   CALCIUM 9.2 10/17/2013 1518   CALCIUM 7.7* 10/09/2013 0520   AST 18 10/17/2013 1518   AST 44* 10/09/2013 0520   ALT 34 10/17/2013 1518   ALT 106* 10/09/2013 0520   ALKPHOS 119* 10/17/2013 1518   ALKPHOS 178* 10/09/2013 0520   BILITOT 0.5 10/17/2013 1518   BILITOT 0.6 10/09/2013 0520   PROT 7.0 10/17/2013 1518   PROT 4.9* 10/09/2013 0520   ALBUMIN 2.8* 10/17/2013 1518   ALBUMIN 1.8* 10/09/2013 0520      Studies/Results: Nm Hepatobiliary Liver Func  10/17/2013   CLINICAL DATA:  Status post partial right lobe hepatectomy for large hemangioma on 09/30/2013. The gallbladder was also removed at that time for chronic cholecystitis. Postoperatively, bile leak was treated by endoscopic stent placement. There is new increased bile output from an indwelling surgical drain.  EXAM: NUCLEAR MEDICINE HEPATOBILIARY IMAGING  TECHNIQUE: Sequential images of the abdomen were obtained out to 60 minutes following intravenous administration of radiopharmaceutical.  RADIOPHARMACEUTICALS:  5.3 Millicurie EN-27P Choletec  COMPARISON:  Abdominal series earlier today as well as prior to ERCP images on 10/06/2013.  FINDINGS: There is initial normal activity within the liver with no delay in biliary excretion. After roughly 40 min, there is visualization of a collection of activity to the right of the expected position of the common bile duct which likely is near the gallbladder fossa. This activity stays in this region and does not spread around the liver, but is suspicious for contained bile leak. Based on the abdominal series, the endoscopic stent does appear to be positioned more inferiorly and medially compared to immediate imaging during ERCP and the stent may have migrated entirely into the duodenum.  IMPRESSION: The nuclear medicine study is positive  for abnormal activity localizing to the region of the gallbladder fossa and likely representing contained bile leak. As above, the previously placed endoscopic biliary stent appears to lie more inferiorly and medially compared to ERCP images and the stent may have migrated into the duodenum.  Findings were discussed with Dr. Zella Richer by telephone.   Electronically Signed   By: Aletta Edouard M.D.   On: 10/17/2013 13:45   Dg Ercp  10/18/2013   CLINICAL DATA:  Bile leak.  EXAM: ERCP  TECHNIQUE: Multiple spot images obtained with the fluoroscopic device and submitted for  interpretation post-procedure.  COMPARISON:  10/06/2013  FINDINGS: Five fluoroscopic spot images over the right upper quadrant were obtained. Exam demonstrates cannulation of the common bile duct with contrast opacification of the common bile duct and intrahepatic ducts. There is no evidence of stricture, focal filling defect or extravasation of contrast. Multiple surgical clips are present over the right upper quadrant. Recommend correlation with findings at the time of the procedure.  IMPRESSION: Opacification of the common bile duct and central intrahepatic ducts without focal abnormality or extravasation of contrast. Recommend correlation with findings at the time of the procedure.  These images were submitted for radiologic interpretation only. Please see the procedural report for the amount of contrast and the fluoroscopy time utilized.   Electronically Signed   By: Marin Olp M.D.   On: 10/18/2013 14:48   Dg Abd Acute W/chest  10/17/2013   CLINICAL DATA:  Abdominal distension. History of partial hepatectomy.  EXAM: ACUTE ABDOMEN SERIES (ABDOMEN 2 VIEW & CHEST 1 VIEW)  COMPARISON:  Chest radiograph- 10/11/2013 ; 09/16/2013  FINDINGS: Grossly unchanged cardiac silhouette and mediastinal contours with obscuration of the right heart border secondary to unchanged small right-sided pleural effusion. Minimally improved aeration of lung bases with persistent bibasilar opacities, right greater than left. No new focal airspace opacities. No pneumothorax.  A surgical drain and multiple surgical clips overlies the right upper abdominal quadrant. The biliary stent overlies expected location of the common bile duct.  Moderate colonic stool burden without evidence of obstruction. No pneumoperitoneum, pneumatosis or portal venous gas.  No acute osseus abnormalities.  IMPRESSION: 1. Moderate colonic stool burden without evidence of obstruction. 2. Postsurgical change of the right upper abdominal quadrant compatible with  provided history of partial hepatectomy. 3. A biliary stent overlies expected location of the CBD. 4. Unchanged small right-sided effusion with associated bibasilar opacities, right greater than left, likely atelectasis.   Electronically Signed   By: Sandi Mariscal M.D.   On: 10/17/2013 10:16    Anti-infectives: Anti-infectives   Start     Dose/Rate Route Frequency Ordered Stop   10/18/13 0800  piperacillin-tazobactam (ZOSYN) IVPB 3.375 g     3.375 g 12.5 mL/hr over 240 Minutes Intravenous Every 8 hours 10/18/13 0713        Assessment Active Problems:   Bile leak, postoperative-HIDA confirms the leak; x-ray suggests migration of stent; drain output down with restricted diet.    LOS: 2 days   Plan: advance diet to soft. See what happens to drain output this next 24 hours.     [facility] 10/19/2013

## 2013-10-19 NOTE — Progress Notes (Signed)
Subjective: Limited bedside communication with patient and family member at bedside. Patient reports pain has improved, is better, but not resolved.  Objective: Vital signs in last 24 hours: Temp:  [97.9 F (36.6 C)-98.5 F (36.9 C)] 97.9 F (36.6 C) (09/08 0600) Pulse Rate:  [51-81] 63 (09/08 0600) Resp:  [10-17] 16 (09/08 0600) BP: (95-115)/(48-74) 95/57 mmHg (09/08 0600) SpO2:  [94 %-100 %] 95 % (09/08 0600) Weight change:  Last BM Date: 10/17/13  PE: GEN:  NAD, non toxic-appearing ABD:  Mild generalized tenderness, RUQ JP drain in place, tender, persistent drainage 280 cc over 24hrs noted   Lab Results: CBC    Component Value Date/Time   WBC 9.7 10/17/2013 1518   RBC 3.85* 10/17/2013 1518   HGB 11.2* 10/17/2013 1518   HCT 34.5* 10/17/2013 1518   PLT 556* 10/17/2013 1518   MCV 89.6 10/17/2013 1518   MCH 29.1 10/17/2013 1518   MCHC 32.5 10/17/2013 1518   RDW 15.2 10/17/2013 1518   LYMPHSABS 1.2 10/03/2013 0538   MONOABS 0.8 10/03/2013 0538   EOSABS 0.0 10/03/2013 0538   BASOSABS 0.0 10/03/2013 0538   CMP     Component Value Date/Time   NA 142 10/17/2013 1518   K 4.1 10/17/2013 1518   CL 105 10/17/2013 1518   CO2 23 10/17/2013 1518   GLUCOSE 99 10/17/2013 1518   BUN 11 10/17/2013 1518   CREATININE 0.61 10/17/2013 1518   CREATININE 0.88 07/21/2012 1357   CALCIUM 9.2 10/17/2013 1518   PROT 7.0 10/17/2013 1518   ALBUMIN 2.8* 10/17/2013 1518   AST 18 10/17/2013 1518   ALT 34 10/17/2013 1518   ALKPHOS 119* 10/17/2013 1518   BILITOT 0.5 10/17/2013 1518   GFRNONAA >90 10/17/2013 1518   GFRAA >90 10/17/2013 1518   Assessment:  1.  Bile leak, with replacement and upsizing of biliary stent.  Plan:  1.  Follow JP drain output, antibiotics, advance diet per surgical team. 2.  Hopefully upsizing the biliary stent helps resolve the leak; if not, then patient would be deemed to have failed endoscopic treatment for this, and there would be no role for any further attempted endoscopic therapy. 3.  Will  follow.   Landry Dyke 10/19/2013, 8:53 AM

## 2013-10-20 MED ORDER — AMOXICILLIN-POT CLAVULANATE 875-125 MG PO TABS
1.0000 | ORAL_TABLET | Freq: Two times a day (BID) | ORAL | Status: DC
  Administered 2013-10-20 – 2013-10-21 (×3): 1 via ORAL
  Filled 2013-10-20 (×4): qty 1

## 2013-10-20 MED ORDER — PANTOPRAZOLE SODIUM 40 MG PO TBEC
40.0000 mg | DELAYED_RELEASE_TABLET | Freq: Every day | ORAL | Status: DC
  Administered 2013-10-20: 40 mg via ORAL
  Filled 2013-10-20 (×2): qty 1

## 2013-10-20 MED ORDER — KCL IN DEXTROSE-NACL 20-5-0.45 MEQ/L-%-% IV SOLN
INTRAVENOUS | Status: DC
  Administered 2013-10-20: 30 mL/h via INTRAVENOUS
  Filled 2013-10-20 (×2): qty 1000

## 2013-10-20 NOTE — Progress Notes (Signed)
Subjective: Still having abdominal pain.  Objective: Vital signs in last 24 hours: Temp:  [97.9 F (36.6 C)-98.4 F (36.9 C)] 98.2 F (36.8 C) (09/09 0600) Pulse Rate:  [54-83] 75 (09/09 0600) Resp:  [16-18] 16 (09/09 0600) BP: (87-102)/(58-69) 96/58 mmHg (09/09 0600) SpO2:  [95 %-100 %] 95 % (09/09 0600) Weight change:  Last BM Date: 10/18/13  PE: GEN:  NAD ABD:  RUQ JP drain with bilious drainage, generalized tenderness without peritonitis  Lab Results: CBC    Component Value Date/Time   WBC 9.7 10/17/2013 1518   RBC 3.85* 10/17/2013 1518   HGB 11.2* 10/17/2013 1518   HCT 34.5* 10/17/2013 1518   PLT 556* 10/17/2013 1518   MCV 89.6 10/17/2013 1518   MCH 29.1 10/17/2013 1518   MCHC 32.5 10/17/2013 1518   RDW 15.2 10/17/2013 1518   LYMPHSABS 1.2 10/03/2013 0538   MONOABS 0.8 10/03/2013 0538   EOSABS 0.0 10/03/2013 0538   BASOSABS 0.0 10/03/2013 0538   CMP     Component Value Date/Time   NA 142 10/17/2013 1518   K 4.1 10/17/2013 1518   CL 105 10/17/2013 1518   CO2 23 10/17/2013 1518   GLUCOSE 99 10/17/2013 1518   BUN 11 10/17/2013 1518   CREATININE 0.61 10/17/2013 1518   CREATININE 0.88 07/21/2012 1357   CALCIUM 9.2 10/17/2013 1518   PROT 7.0 10/17/2013 1518   ALBUMIN 2.8* 10/17/2013 1518   AST 18 10/17/2013 1518   ALT 34 10/17/2013 1518   ALKPHOS 119* 10/17/2013 1518   BILITOT 0.5 10/17/2013 1518   GFRNONAA >90 10/17/2013 1518   GFRAA >90 10/17/2013 1518   Assessment:  1.  Bile leak. 2.  Abdominal pain.  Plan:  1.  Watch drain output. 2.  Continue antibiotics and dietary management per surgery. 3.  Doubt utility of third ERCP, if JP drain output does not resolve. 4.  Will sign-off; please call with questions.   Landry Dyke 10/20/2013, 9:49 AM

## 2013-10-20 NOTE — Progress Notes (Signed)
Patient ID: Deborah Trujillo, female   DOB: [birth year], 48 y.o.   MRN: [id number] 2 Days Post-Op  Subjective: Drainage did come down, but pt still having pain.  No n/v, OK but not great appetite  Objective: Vital signs in last 24 hours: Temp:  [97.9 F (36.6 C)-98.4 F (36.9 C)] 98.2 F (36.8 C) (09/09 0600) Pulse Rate:  [54-83] 75 (09/09 0600) Resp:  [16-18] 16 (09/09 0600) BP: (87-102)/(58-69) 96/58 mmHg (09/09 0600) SpO2:  [95 %-100 %] 95 % (09/09 0600) Last BM Date: 10/18/13  Intake/Output from previous day: 09/08 0701 - 09/09 0700 In: 3740.3 [P.O.:360; I.V.:3330.3; IV Piggyback:50] Out: 36 [Urine:800; Drains:88] Intake/Output this shift:    PE: General- In NAD Abdomen-soft, bilious drain output, tender around drain site, but no evidence of infection.  Lab Results:   Recent Labs  10/17/13 1518  WBC 9.7  HGB 11.2*  HCT 34.5*  PLT 556*   BMET  Recent Labs  10/17/13 1518  NA 142  K 4.1  CL 105  CO2 23  GLUCOSE 99  BUN 11  CREATININE 0.61  CALCIUM 9.2   PT/INR No results found for this basename: LABPROT, INR,  in the last 72 hours Comprehensive Metabolic Panel:    Component Value Date/Time   NA 142 10/17/2013 1518   NA 142 10/09/2013 0520   K 4.1 10/17/2013 1518   K 3.5* 10/09/2013 0520   CL 105 10/17/2013 1518   CL 107 10/09/2013 0520   CO2 23 10/17/2013 1518   CO2 24 10/09/2013 0520   BUN 11 10/17/2013 1518   BUN 4* 10/09/2013 0520   CREATININE 0.61 10/17/2013 1518   CREATININE 0.63 10/09/2013 0520   CREATININE 0.88 07/21/2012 1357   GLUCOSE 99 10/17/2013 1518   GLUCOSE 104* 10/09/2013 0520   CALCIUM 9.2 10/17/2013 1518   CALCIUM 7.7* 10/09/2013 0520   AST 18 10/17/2013 1518   AST 44* 10/09/2013 0520   ALT 34 10/17/2013 1518   ALT 106* 10/09/2013 0520   ALKPHOS 119* 10/17/2013 1518   ALKPHOS 178* 10/09/2013 0520   BILITOT 0.5 10/17/2013 1518   BILITOT 0.6 10/09/2013 0520   PROT 7.0 10/17/2013 1518   PROT 4.9* 10/09/2013 0520   ALBUMIN 2.8* 10/17/2013 1518   ALBUMIN  1.8* 10/09/2013 0520     Studies/Results: Dg Ercp  10/18/2013   CLINICAL DATA:  Bile leak.  EXAM: ERCP  TECHNIQUE: Multiple spot images obtained with the fluoroscopic device and submitted for interpretation post-procedure.  COMPARISON:  10/06/2013  FINDINGS: Five fluoroscopic spot images over the right upper quadrant were obtained. Exam demonstrates cannulation of the common bile duct with contrast opacification of the common bile duct and intrahepatic ducts. There is no evidence of stricture, focal filling defect or extravasation of contrast. Multiple surgical clips are present over the right upper quadrant. Recommend correlation with findings at the time of the procedure.  IMPRESSION: Opacification of the common bile duct and central intrahepatic ducts without focal abnormality or extravasation of contrast. Recommend correlation with findings at the time of the procedure.  These images were submitted for radiologic interpretation only. Please see the procedural report for the amount of contrast and the fluoroscopy time utilized.   Electronically Signed   By: Marin Olp M.D.   On: 10/18/2013 14:48    Anti-infectives: Anti-infectives   Start     Dose/Rate Route Frequency Ordered Stop   10/20/13 1000  amoxicillin-clavulanate (AUGMENTIN) 875-125 MG per tablet 1 tablet     1 tablet Oral  Every 12 hours 10/20/13 0839     10/18/13 0800  piperacillin-tazobactam (ZOSYN) IVPB 3.375 g  Status:  Discontinued     3.375 g 12.5 mL/hr over 240 Minutes Intravenous Every 8 hours 10/18/13 0713 10/20/13 0839      Assessment Active Problems:   Bile leak, postoperative-HIDA confirms the leak; x-ray suggests migration of stent  LOS: 3 days   Plan: soft diet Switch to PO antibiotics Decrease IVF Consider CT scan if no clinical improvement.  See what happens to drain output this next 24 hours.     Shana Younge 10/20/2013

## 2013-10-21 MED ORDER — OXYCODONE HCL 5 MG PO TABS: 5.0000 mg | ORAL_TABLET | ORAL | Status: DC | PRN

## 2013-10-21 MED ORDER — OMEPRAZOLE 20 MG PO CPDR: 20.0000 mg | DELAYED_RELEASE_CAPSULE | Freq: Every day | ORAL | Status: AC

## 2013-10-21 MED ORDER — AMOXICILLIN-POT CLAVULANATE 875-125 MG PO TABS: 1.0000 | ORAL_TABLET | Freq: Two times a day (BID) | ORAL | Status: DC

## 2013-10-21 NOTE — Discharge Instructions (Signed)
Measure and record drain output twice daily.    Follow up next week as scheduled.

## 2013-10-21 NOTE — Discharge Summary (Signed)
Physician Discharge Summary  Patient ID: Deborah Trujillo MRN: [id number] DOB/AGE: 1965-12-31 48 y.o.  Admit date: 10/17/2013 Discharge date: 10/21/2013  Admission Diagnoses: Bile leak post operative  Discharge Diagnoses:  Active Problems:   Bile leak, postoperative   Discharged Condition: improved  Hospital Course:  Pt was readmitted to the hospital after her blake drain output started going up dramatically.  When she was discharged last week, her drain output was down to 40 ml/day.  However, the bulb started filling up every hour.  She was found to have stent migration.  She underwent ERCP and had the stent exchanged for a longer, larger diameter stent.  The output came back down to 65 mL over the last 24 hours.  She was able to eat and tolerate PO pain control.  She was afebrile with a normal WBC.  She was placed on zosyn on admit, and this was transitioned to augmentin 24 hours prior to d/c.  She is discharged to home on oral antibiotics.  She is instructed to stay on a low fat diet.    Consults: GI  Significant Diagnostic Studies: radiology: HIDA, ERCP  Treatments: IV hydration, antibiotics: Zosyn and procedures: ERCP with biliary stent placement  Discharge Exam: Blood pressure 99/56, pulse 78, temperature 99.2 F (37.3 C), temperature source Oral, resp. rate 16, height 4\' 8"  (1.422 m), weight 146 lb 9.6 oz (66.497 kg), SpO2 93.00%. General appearance: alert, cooperative and no distress Resp: breathing comfortably Cardio: regular rate and rhythm GI: soft, non-tender; bowel sounds normal; no masses,  no organomegaly and blake with bile present, small amt.    Disposition: 01-Home or Self Care  Discharge Instructions   Call MD for:  difficulty breathing, headache or visual disturbances    Complete by:  As directed      Call MD for:  persistant nausea and vomiting    Complete by:  As directed      Call MD for:  redness, tenderness, or signs of infection (pain, swelling,  redness, odor or green/yellow discharge around incision site)    Complete by:  As directed      Call MD for:  severe uncontrolled pain    Complete by:  As directed      Call MD for:  temperature >100.4    Complete by:  As directed      Diet - low sodium heart healthy    Complete by:  As directed      Discharge instructions    Complete by:  As directed   Measure and record drain output twice daily.  Bring record to clinic.     Increase activity slowly    Complete by:  As directed             Medication List    STOP taking these medications       sulfamethoxazole-trimethoprim 800-160 MG per tablet  Commonly known as:  BACTRIM DS,SEPTRA DS      TAKE these medications       amoxicillin-clavulanate 875-125 MG per tablet  Commonly known as:  AUGMENTIN  Take 1 tablet by mouth every 12 (twelve) hours.     colestipol 1 G tablet  Commonly known as:  COLESTID  Take 2 tablets (2 g total) by mouth 3 (three) times daily.     omeprazole 20 MG capsule  Commonly known as:  PRILOSEC  Take 1 capsule (20 mg total) by mouth daily.     oxyCODONE 5 MG immediate release tablet  Commonly known  as:  Oxy IR/ROXICODONE  Take 1-2 tablets (5-10 mg total) by mouth every 4 (four) hours as needed for moderate pain, severe pain or breakthrough pain.           Follow-up Information   Follow up with [facility], MD In 1 week.   Specialty:  General Surgery   Contact information:   [address] [place] [address] [phone]       Signed: Stark Klein 10/21/2013, 10:18 AM

## 2013-10-21 NOTE — Progress Notes (Addendum)
Patient and daughter at bedside given discharge instructions via interpreter.  Prescription also given and discussed with daughter and patient.  Questions asked and answered.

## 2013-10-26 ENCOUNTER — Telehealth (INDEPENDENT_AMBULATORY_CARE_PROVIDER_SITE_OTHER): Payer: Self-pay

## 2013-10-26 NOTE — Telephone Encounter (Signed)
Pt's temperature is normal this morning.  She has been taking Oxycodone q 4 hrs since last night and the pain has somewhat resolved.  Advised to call if another spike in temperature.  Will see the patient at post op on Friday.

## 2013-10-26 NOTE — Telephone Encounter (Signed)
No answer on cell phone.  LM on home VM pt will need a CT scan asap as well as changing her Rx to Cipro 500mg  bid.  Please ask for Jearld Fenton if questions.

## 2013-10-27 ENCOUNTER — Other Ambulatory Visit (INDEPENDENT_AMBULATORY_CARE_PROVIDER_SITE_OTHER): Payer: Self-pay | Admitting: General Surgery

## 2013-10-27 ENCOUNTER — Other Ambulatory Visit (INDEPENDENT_AMBULATORY_CARE_PROVIDER_SITE_OTHER): Payer: Self-pay

## 2013-10-27 ENCOUNTER — Ambulatory Visit
Admission: RE | Admit: 2013-10-27 | Discharge: 2013-10-27 | Disposition: A | Discharge status: Home / Self Care | Payer: No Typology Code available for payment source | Source: Ambulatory Visit | Attending: General Surgery | Admitting: General Surgery

## 2013-10-27 DIAGNOSIS — R5081 Fever presenting with conditions classified elsewhere: Secondary | ICD-10-CM

## 2013-10-27 MED ORDER — IOHEXOL 300 MG/ML  SOLN
100.0000 mL | Freq: Once | INTRAMUSCULAR | Status: AC | PRN
  Administered 2013-10-27: 100 mL via INTRAVENOUS

## 2013-10-28 ENCOUNTER — Other Ambulatory Visit (INDEPENDENT_AMBULATORY_CARE_PROVIDER_SITE_OTHER): Payer: Self-pay | Admitting: General Surgery

## 2013-10-28 ENCOUNTER — Ambulatory Visit (HOSPITAL_COMMUNITY)
Admission: RE | Admit: 2013-10-28 | Discharge: 2013-10-28 | Disposition: A | Discharge status: Home / Self Care | Payer: Self-pay | Source: Ambulatory Visit | Attending: General Surgery | Admitting: General Surgery

## 2013-10-28 ENCOUNTER — Observation Stay: Admission: AD | Admit: 2013-10-28 | Payer: Self-pay | Source: Ambulatory Visit

## 2013-10-28 ENCOUNTER — Ambulatory Visit (HOSPITAL_COMMUNITY)
Admission: RE | Admit: 2013-10-28 | Discharge: 2013-10-28 | Disposition: A | Discharge status: Home / Self Care | Payer: Self-pay | Source: Ambulatory Visit | Attending: Interventional Radiology | Admitting: Interventional Radiology

## 2013-10-28 ENCOUNTER — Inpatient Hospital Stay (HOSPITAL_COMMUNITY)
Admission: AD | Admit: 2013-10-28 | Discharge: 2013-11-02 | DRG: 394 | Disposition: A | Discharge status: Home / Self Care | Payer: Self-pay | Source: Ambulatory Visit | Attending: General Surgery | Admitting: General Surgery

## 2013-10-28 ENCOUNTER — Other Ambulatory Visit: Payer: Self-pay | Admitting: Radiology

## 2013-10-28 VITALS — BP 108/68 | HR 91 | Temp 98.4°F | Resp 20 | Ht 59.0 in | Wt 152.6 lb

## 2013-10-28 DIAGNOSIS — K668 Other specified disorders of peritoneum: Secondary | ICD-10-CM | POA: Diagnosis present

## 2013-10-28 DIAGNOSIS — K91871 Postprocedural hematoma of a digestive system organ or structure following other procedure: Secondary | ICD-10-CM

## 2013-10-28 DIAGNOSIS — K929 Disease of digestive system, unspecified: Principal | ICD-10-CM | POA: Diagnosis present

## 2013-10-28 DIAGNOSIS — Y849 Medical procedure, unspecified as the cause of abnormal reaction of the patient, or of later complication, without mention of misadventure at the time of the procedure: Secondary | ICD-10-CM | POA: Diagnosis present

## 2013-10-28 DIAGNOSIS — K5900 Constipation, unspecified: Secondary | ICD-10-CM | POA: Diagnosis present

## 2013-10-28 DIAGNOSIS — K838 Other specified diseases of biliary tract: Secondary | ICD-10-CM | POA: Diagnosis present

## 2013-10-28 DIAGNOSIS — J90 Pleural effusion, not elsewhere classified: Secondary | ICD-10-CM

## 2013-10-28 DIAGNOSIS — E785 Hyperlipidemia, unspecified: Secondary | ICD-10-CM | POA: Diagnosis present

## 2013-10-28 DIAGNOSIS — Z79899 Other long term (current) drug therapy: Secondary | ICD-10-CM

## 2013-10-28 LAB — CBC
HEMATOCRIT: 38.8 % (ref 36.0–46.0)
HEMOGLOBIN: 12.8 g/dL (ref 12.0–15.0)
MCH: 28.6 pg (ref 26.0–34.0)
MCHC: 33 g/dL (ref 30.0–36.0)
MCV: 86.6 fL (ref 78.0–100.0)
Platelets: 333 10*3/uL (ref 150–400)
RBC: 4.48 MIL/uL (ref 3.87–5.11)
RDW: 14.5 % (ref 11.5–15.5)
WBC: 13.6 10*3/uL — ABNORMAL HIGH (ref 4.0–10.5)

## 2013-10-28 LAB — COMPREHENSIVE METABOLIC PANEL
ALK PHOS: 90 U/L (ref 39–117)
ALT: 12 U/L (ref 0–35)
AST: 15 U/L (ref 0–37)
Albumin: 2.1 g/dL — ABNORMAL LOW (ref 3.5–5.2)
Anion gap: 16 — ABNORMAL HIGH (ref 5–15)
BILIRUBIN TOTAL: 0.4 mg/dL (ref 0.3–1.2)
BUN: 8 mg/dL (ref 6–23)
CHLORIDE: 98 meq/L (ref 96–112)
CO2: 21 meq/L (ref 19–32)
Calcium: 8.3 mg/dL — ABNORMAL LOW (ref 8.4–10.5)
Creatinine, Ser: 0.55 mg/dL (ref 0.50–1.10)
GFR calc Af Amer: >90 mL/min (ref >90)
Glucose, Bld: 112 mg/dL — ABNORMAL HIGH (ref 70–99)
POTASSIUM: 4.2 meq/L (ref 3.7–5.3)
SODIUM: 135 meq/L — AB (ref 137–147)
Total Protein: 6.1 g/dL (ref 6.0–8.3)

## 2013-10-28 LAB — CBC WITH DIFFERENTIAL/PLATELET
Basophils Absolute: 0 10*3/uL (ref 0.0–0.1)
Basophils Relative: 0 % (ref 0–1)
EOS PCT: 0 % (ref 0–5)
Eosinophils Absolute: 0 10*3/uL (ref 0.0–0.7)
HCT: 34.8 % — ABNORMAL LOW (ref 36.0–46.0)
Hemoglobin: 11.4 g/dL — ABNORMAL LOW (ref 12.0–15.0)
LYMPHS ABS: 1 10*3/uL (ref 0.7–4.0)
Lymphocytes Relative: 9 % — ABNORMAL LOW (ref 12–46)
MCH: 28.3 pg (ref 26.0–34.0)
MCHC: 32.8 g/dL (ref 30.0–36.0)
MCV: 86.4 fL (ref 78.0–100.0)
MONO ABS: 0.8 10*3/uL (ref 0.1–1.0)
Monocytes Relative: 7 % (ref 3–12)
NEUTROS ABS: 9.1 10*3/uL — AB (ref 1.7–7.7)
Neutrophils Relative %: 84 % — ABNORMAL HIGH (ref 43–77)
Platelets: 290 10*3/uL (ref 150–400)
RBC: 4.03 MIL/uL (ref 3.87–5.11)
RDW: 14.6 % (ref 11.5–15.5)
WBC: 10.9 10*3/uL — AB (ref 4.0–10.5)

## 2013-10-28 LAB — MAGNESIUM: Magnesium: 1.6 mg/dL (ref 1.5–2.5)

## 2013-10-28 LAB — PROTIME-INR
INR: 1.3 (ref 0.00–1.49)
INR: 1.33 (ref 0.00–1.49)
Prothrombin Time: 16.2 seconds — ABNORMAL HIGH (ref 11.6–15.2)
Prothrombin Time: 16.5 seconds — ABNORMAL HIGH (ref 11.6–15.2)

## 2013-10-28 LAB — APTT: aPTT: 38 seconds — ABNORMAL HIGH (ref 24–37)

## 2013-10-28 LAB — PROTEIN, BODY FLUID: TOTAL PROTEIN, FLUID: 4.5 g/dL

## 2013-10-28 LAB — PHOSPHORUS: Phosphorus: 3.3 mg/dL (ref 2.3–4.6)

## 2013-10-28 MED ORDER — DIPHENHYDRAMINE HCL 12.5 MG/5ML PO ELIX: 12.5000 mg | ORAL_SOLUTION | Freq: Four times a day (QID) | ORAL | Status: DC | PRN

## 2013-10-28 MED ORDER — PIPERACILLIN-TAZOBACTAM 3.375 G IVPB
3.3750 g | Freq: Three times a day (TID) | INTRAVENOUS | Status: DC
  Administered 2013-10-28 – 2013-11-01 (×12): 3.375 g via INTRAVENOUS
  Filled 2013-10-28 (×14): qty 50

## 2013-10-28 MED ORDER — FENTANYL CITRATE 0.05 MG/ML IJ SOLN
INTRAMUSCULAR | Status: AC
  Filled 2013-10-28: qty 2

## 2013-10-28 MED ORDER — MORPHINE SULFATE 2 MG/ML IJ SOLN
INTRAMUSCULAR | Status: AC
  Filled 2013-10-28: qty 1

## 2013-10-28 MED ORDER — OXYCODONE HCL 5 MG PO TABS
5.0000 mg | ORAL_TABLET | ORAL | Status: DC | PRN
  Administered 2013-10-29 – 2013-11-02 (×17): 10 mg via ORAL
  Filled 2013-10-28 (×17): qty 2

## 2013-10-28 MED ORDER — FENTANYL CITRATE 0.05 MG/ML IJ SOLN
INTRAMUSCULAR | Status: AC | PRN
  Administered 2013-10-28 (×2): 25 ug via INTRAVENOUS

## 2013-10-28 MED ORDER — COLESTIPOL HCL 1 G PO TABS
2.0000 g | ORAL_TABLET | Freq: Three times a day (TID) | ORAL | Status: DC
  Filled 2013-10-28 (×4): qty 2

## 2013-10-28 MED ORDER — LIDOCAINE HCL (PF) 1 % IJ SOLN
INTRAMUSCULAR | Status: AC
  Filled 2013-10-28: qty 5

## 2013-10-28 MED ORDER — MIDAZOLAM HCL 2 MG/2ML IJ SOLN
INTRAMUSCULAR | Status: AC | PRN
  Administered 2013-10-28: 1 mg via INTRAVENOUS
  Administered 2013-10-28: 0.5 mg via INTRAVENOUS

## 2013-10-28 MED ORDER — MIDAZOLAM HCL 2 MG/2ML IJ SOLN
INTRAMUSCULAR | Status: AC | PRN
  Administered 2013-10-28: 1 mg via INTRAVENOUS

## 2013-10-28 MED ORDER — LIDOCAINE HCL (PF) 1 % IJ SOLN
INTRAMUSCULAR | Status: AC
  Filled 2013-10-28: qty 10

## 2013-10-28 MED ORDER — LIDOCAINE HCL 1 % IJ SOLN
INTRAMUSCULAR | Status: AC
  Filled 2013-10-28: qty 20

## 2013-10-28 MED ORDER — MORPHINE SULFATE 2 MG/ML IJ SOLN
1.0000 mg | INTRAMUSCULAR | Status: DC | PRN
  Administered 2013-10-28 – 2013-11-01 (×11): 2 mg via INTRAVENOUS
  Filled 2013-10-28 (×11): qty 1

## 2013-10-28 MED ORDER — PANTOPRAZOLE SODIUM 40 MG IV SOLR
40.0000 mg | Freq: Every day | INTRAVENOUS | Status: DC
  Administered 2013-10-28: 40 mg via INTRAVENOUS
  Filled 2013-10-28 (×2): qty 40

## 2013-10-28 MED ORDER — SODIUM CHLORIDE 0.9 % IV SOLN
INTRAVENOUS | Status: AC | PRN
  Administered 2013-10-28: 10 mL/h via INTRAVENOUS

## 2013-10-28 MED ORDER — ONDANSETRON HCL 4 MG/2ML IJ SOLN: 4.0000 mg | Freq: Four times a day (QID) | INTRAMUSCULAR | Status: DC | PRN

## 2013-10-28 MED ORDER — KCL IN DEXTROSE-NACL 20-5-0.45 MEQ/L-%-% IV SOLN
INTRAVENOUS | Status: DC
  Administered 2013-10-28 – 2013-11-01 (×4): via INTRAVENOUS
  Filled 2013-10-28 (×6): qty 1000

## 2013-10-28 MED ORDER — DIPHENHYDRAMINE HCL 50 MG/ML IJ SOLN: 12.5000 mg | Freq: Four times a day (QID) | INTRAMUSCULAR | Status: DC | PRN

## 2013-10-28 MED ORDER — FENTANYL CITRATE 0.05 MG/ML IJ SOLN
INTRAMUSCULAR | Status: AC | PRN
  Administered 2013-10-28 (×3): 25 ug via INTRAVENOUS

## 2013-10-28 MED ORDER — HEPARIN SODIUM (PORCINE) 5000 UNIT/ML IJ SOLN
5000.0000 [IU] | Freq: Three times a day (TID) | INTRAMUSCULAR | Status: DC
  Administered 2013-10-29 – 2013-10-30 (×5): 5000 [IU] via SUBCUTANEOUS
  Filled 2013-10-28 (×18): qty 1

## 2013-10-28 MED ORDER — MIDAZOLAM HCL 2 MG/2ML IJ SOLN
INTRAMUSCULAR | Status: AC
  Filled 2013-10-28: qty 2

## 2013-10-28 MED ORDER — SODIUM CHLORIDE 0.9 % IV SOLN: Freq: Once | INTRAVENOUS | Status: DC

## 2013-10-28 MED ORDER — DOCUSATE SODIUM 100 MG PO CAPS
100.0000 mg | ORAL_CAPSULE | Freq: Two times a day (BID) | ORAL | Status: DC
  Administered 2013-10-29 – 2013-11-02 (×9): 100 mg via ORAL
  Filled 2013-10-28 (×9): qty 1

## 2013-10-28 MED ORDER — ACETAMINOPHEN 10 MG/ML IV SOLN
1000.0000 mg | Freq: Once | INTRAVENOUS | Status: AC
  Administered 2013-10-28: 1000 mg via INTRAVENOUS
  Filled 2013-10-28: qty 100

## 2013-10-28 MED ORDER — MORPHINE SULFATE 2 MG/ML IJ SOLN
2.0000 mg | Freq: Once | INTRAMUSCULAR | Status: AC
  Administered 2013-10-28: 2 mg via INTRAVENOUS
  Filled 2013-10-28: qty 1

## 2013-10-28 NOTE — Progress Notes (Signed)
ANTIBIOTIC CONSULT NOTE - INITIAL  Pharmacy Consult for Zosyn Indication: abdominal abscess  No Known Allergies  Patient Measurements: Height: 4\' 11"  (149.9 cm) Weight: 152 lb 9.6 oz (69.219 kg) IBW/kg (Calculated) : 43.2 Adjusted Body Weight: 53.6 kg  Vital Signs: Temp: 102.6 F (39.2 C) (09/17 2010) Temp src: Oral (09/17 2010) BP: 110/69 mmHg (09/17 2010) Pulse Rate: 105 (09/17 2010) Intake/Output from previous day:   Intake/Output from this shift:    Labs:  Recent Labs  10/28/13 1259  WBC 13.6*  HGB 12.8  PLT 333   Estimated Creatinine Clearance: 73.6 ml/min (by C-G formula based on Cr of 0.61). No results found for this basename: VANCOTROUGH, Corlis Leak, VANCORANDOM, [place], GENTPEAK, GENTRANDOM, TOBRATROUGH, TOBRAPEAK, TOBRARND, AMIKACINPEAK, AMIKACINTROU, AMIKACIN,  in the last 72 hours   Microbiology: Recent Results (from the past 720 hour(s))  CULTURE, EXPECTORATED SPUTUM-ASSESSMENT     Status: None   Collection Time    10/06/13  2:57 PM      Result Value Ref Range Status   Specimen Description SPUTUM   Final   Special Requests Normal   Final   Sputum evaluation     Final   Value: THIS SPECIMEN IS ACCEPTABLE. RESPIRATORY CULTURE REPORT TO FOLLOW.   Report Status 10/06/2013 FINAL   Final  CULTURE, RESPIRATORY (NON-EXPECTORATED)     Status: None   Collection Time    10/06/13  2:57 PM      Result Value Ref Range Status   Specimen Description SPUTUM   Final   Special Requests NONE   Final   Gram Stain     Final   Value: MODERATE WBC PRESENT,BOTH PMN AND MONONUCLEAR     NO SQUAMOUS EPITHELIAL CELLS SEEN     NO ORGANISMS SEEN     Performed at [facility]   Culture     Final   Value: NO GROWTH 2 DAYS     Performed at [facility]   Report Status 10/09/2013 FINAL   Final  CLOSTRIDIUM DIFFICILE BY PCR     Status: None   Collection Time    10/07/13 10:36 AM      Result Value Ref Range Status   C difficile by pcr NEGATIVE  NEGATIVE  Final   Comment: Performed at [place] History: Past Medical History  Diagnosis Date  . Liver mass   . Hyperlipidemia   . Pain     PAIN - BURNING SENSATION RIGHT SIDE - BLOOD WORK ABNORMAL - FOUND TO HAVE LIVER MASS  . Colitis     Medications:  Prescriptions prior to admission  Medication Sig Dispense Refill  . amoxicillin-clavulanate (AUGMENTIN) 875-125 MG per tablet Take 1 tablet by mouth every 12 (twelve) hours.  14 tablet  0  . colestipol (COLESTID) 1 G tablet Take 2 tablets (2 g total) by mouth 3 (three) times daily.  90 tablet  1  . omeprazole (PRILOSEC) 20 MG capsule Take 1 capsule (20 mg total) by mouth daily.  30 capsule  0  . oxyCODONE (OXY IR/ROXICODONE) 5 MG immediate release tablet Take 1-2 tablets (5-10 mg total) by mouth every 4 (four) hours as needed for moderate pain, severe pain or breakthrough pain.  50 tablet  0   Scheduled:  . colestipol  2 g Oral TID  . docusate sodium  100 mg Oral BID  . [START ON 10/29/2013] heparin  5,000 Units Subcutaneous 3 times per day  . lidocaine      .  pantoprazole (PROTONIX) IV  40 mg Intravenous QHS   Infusions:  . dextrose 5 % and 0.45 % NaCl with KCl 20 mEq/L     Assessment: 48yo female presents with pleural effusion and abdominal fluid collection. Pt has percutaneous drain placed into abdominal abscess of right flank. Pharmacy is consulted to dose zosyn. Tmax is 102.6, WBC 13.6, sCr wnl.  Goal of Therapy:  resolution of infection  Plan:  Start Zosyn 3.375g IV q8h Follow up culture results and renal function.  Deborah Trujillo. Diona Foley, PharmD Clinical Pharmacist Pager [phone] 10/28/2013,8:35 PM

## 2013-10-28 NOTE — Sedation Documentation (Signed)
Patient having pain at drain site. 72mcg fentanyl given IV.

## 2013-10-28 NOTE — Sedation Documentation (Addendum)
Dr. Earleen Newport ordered CT scan of abdomen.  Pt transported to CT via stretcher, RN x 2, and monitor.

## 2013-10-28 NOTE — Sedation Documentation (Signed)
94% SpO2 on RA

## 2013-10-28 NOTE — Sedation Documentation (Signed)
Patient states pain 6/10, "much better than before," and is resting comfortably.

## 2013-10-28 NOTE — Sedation Documentation (Signed)
MD at bedside. 

## 2013-10-28 NOTE — Sedation Documentation (Signed)
Drain and statlock in place.

## 2013-10-28 NOTE — Sedation Documentation (Signed)
Vital signs stable. 

## 2013-10-28 NOTE — Procedures (Signed)
Procedure: 1) US guided right thoracentesis = 450cc greenish-yellow fluid.  Sample to lab 2) US guided right abdominal fluid collection aspiration = 10cc's of bilious fluid.  Sample to lab Patient complains of abdominal pain.  Will send for non-con abd CT Hemodynamically normal.  No complications. No significant blood loss. Signed,  Johny Shears, DO

## 2013-10-28 NOTE — Progress Notes (Signed)
Pt temperature 102.6. Dr. Marlou Starks notified, new order for IV tylenol. No other orders at this time. IV tylenol given, last temp 100.9. Oncoming Rn notified.

## 2013-10-28 NOTE — Procedures (Signed)
Procedure:  Repositioning of percutaneous drain of right flank into abdominal abscess.  Previous US guided drain extended beyond fluid collection.  New 10.75F pigtail into collection of upper abdomen.   30cc of yellow-green fluid. To bulb suction. No complication. No blood loss. Patient stable throughout. Signed,  Johny Shears, DO

## 2013-10-28 NOTE — H&P (Signed)
Chief Complaint: Rt pleural effusion and Rt abdominal fluid collection Severe abd pain  Referring Physician(s): Byerly,Faera  History of Present Illness: Deborah Trujillo is a 48 y.o. female   Pt underwent partial hepatectomy for hemangioma and cholecystotomy 06/07/8339 Complicated by post op bile leak ERCP and biliary stenting 10/06/13 Pt did well for weeks Developed cough;  9/6 CXR revealing R Pleural effusion Has developed increase in abdominal pain x several days Output of abdominal surgical drain has changed color and odor CT 9/16 reveals large Rt abdominal fluid collection and larger Rt pleural effusion Now scheduled for Rt thoracentesis and Rt abdominal fluid collection drain placement  Also complains of constipation Has been using Oxycontin at home every 4 hrs Has not has BM in 3-4 days   Past Medical History  Diagnosis Date  . Liver mass   . Hyperlipidemia   . Pain     PAIN - BURNING SENSATION RIGHT SIDE - BLOOD WORK ABNORMAL - FOUND TO HAVE LIVER MASS  . Colitis     Past Surgical History  Procedure Laterality Date  . Neck mass excision  2009    absess removed   . Tubal ligation    . Laparoscopic partial hepatectomy Right 09/30/2013    Procedure: PARTIAL RIGHT HEPATECTOMY; CHOLECYSTECTOMY;  Surgeon: Stark Klein, MD;  Location: [facility];  Service: General;  Laterality: Right;  . Ercp N/A 10/06/2013    Procedure: ENDOSCOPIC RETROGRADE CHOLANGIOPANCREATOGRAPHY (ERCP);  Surgeon: Arta Silence, MD;  Location: [facility];  Service: Endoscopy;  Laterality: N/A;  . Ercp N/A 10/18/2013    Procedure: ENDOSCOPIC RETROGRADE CHOLANGIOPANCREATOGRAPHY (ERCP);  Surgeon: Jeryl Columbia, MD;  Location: [facility];  Service: Endoscopy;  Laterality: N/A;    Allergies: Review of patient's allergies indicates no known allergies.  Medications: Prior to Admission medications   Medication Sig Start Date End Date Taking? Authorizing Provider  amoxicillin-clavulanate (AUGMENTIN)  875-125 MG per tablet Take 1 tablet by mouth every 12 (twelve) hours. 10/21/13   Stark Klein, MD  colestipol (COLESTID) 1 G tablet Take 2 tablets (2 g total) by mouth 3 (three) times daily. 10/12/13   Stark Klein, MD  omeprazole (PRILOSEC) 20 MG capsule Take 1 capsule (20 mg total) by mouth daily. 10/21/13   Stark Klein, MD  oxyCODONE (OXY IR/ROXICODONE) 5 MG immediate release tablet Take 1-2 tablets (5-10 mg total) by mouth every 4 (four) hours as needed for moderate pain, severe pain or breakthrough pain. 10/21/13   Stark Klein, MD    Family History  Problem Relation Age of Onset  . Diabetes Mother     History   Social History  . Marital Status: Single    Spouse Name: N/A    Number of Children: N/A  . Years of Education: N/A   Social History Main Topics  . Smoking status: Never Smoker   . Smokeless tobacco: Never Used  . Alcohol Use: No  . Drug Use: No  . Sexual Activity: Not on file   Other Topics Concern  . Not on file   Social History Narrative  . No narrative on file      Review of Systems: A 12 point ROS discussed and pertinent positives are indicated in the HPI above.   Review of Systems  Constitutional: Positive for fever, activity change, appetite change, fatigue and unexpected weight change.  Respiratory: Positive for cough, chest tightness, shortness of breath and wheezing.   Cardiovascular: Positive for chest pain.  Gastrointestinal: Positive for nausea, abdominal pain, constipation and  abdominal distention. Negative for vomiting and blood in stool.  Genitourinary: Negative for difficulty urinating.  Musculoskeletal: Positive for back pain.  Skin: Negative for color change.  Neurological: Positive for weakness and light-headedness. Negative for dizziness.  Psychiatric/Behavioral: Negative for behavioral problems.    Vital Signs: BP 108/78  Pulse 107  Temp(Src) 99.8 F (37.7 C) (Oral)  Resp 18  Ht 5' (1.524 m)  Wt 65 kg (143 lb 4.8 oz)  BMI 27.99  kg/m2  SpO2 94%  Physical Exam  Constitutional: She appears well-developed.  Cardiovascular: Normal rate, regular rhythm and normal heart sounds.   No murmur heard. Pulmonary/Chest: Effort normal. No respiratory distress. She has rales.  Abdominal: Soft. Bowel sounds are normal. She exhibits distension. There is tenderness.  Existing post surgical drain in place Rt abdomen  Musculoskeletal: Normal range of motion.  Neurological: She is alert.  Skin: Skin is warm and dry.  Psychiatric: She has a normal mood and affect. Her behavior is normal. Judgment and thought content normal.  Does not speak [place] Dtr in law with pt as interpreter Consented with Dtr in law    Imaging: Dg Chest 2 View  10/11/2013   CLINICAL DATA:  Followup. Cough evaluate pleural effusion and pneumonia.  EXAM: CHEST  2 VIEW  COMPARISON:  10/06/2013  FINDINGS: The heart size appears normal. There is a drainage catheter in the projection of the right upper quadrant. Moderate right pleural effusion is identified and appears unchanged in volume from previous exam. Small left pleural effusion is also again noted.  IMPRESSION: No change in bilateral pleural effusions right greater than left.   Electronically Signed   By: Kerby Moors M.D.   On: 10/11/2013 09:11   Nm Hepatobiliary Liver Func  10/17/2013   CLINICAL DATA:  Status post partial right lobe hepatectomy for large hemangioma on 09/30/2013. The gallbladder was also removed at that time for chronic cholecystitis. Postoperatively, bile leak was treated by endoscopic stent placement. There is new increased bile output from an indwelling surgical drain.  EXAM: NUCLEAR MEDICINE HEPATOBILIARY IMAGING  TECHNIQUE: Sequential images of the abdomen were obtained out to 60 minutes following intravenous administration of radiopharmaceutical.  RADIOPHARMACEUTICALS:  5.3 [employer] QQ-76P Choletec  COMPARISON:  Abdominal series earlier today as well as prior to ERCP images on  10/06/2013.  FINDINGS: There is initial normal activity within the liver with no delay in biliary excretion. After roughly 40 min, there is visualization of a collection of activity to the right of the expected position of the common bile duct which likely is near the gallbladder fossa. This activity stays in this region and does not spread around the liver, but is suspicious for contained bile leak. Based on the abdominal series, the endoscopic stent does appear to be positioned more inferiorly and medially compared to immediate imaging during ERCP and the stent may have migrated entirely into the duodenum.  IMPRESSION: The nuclear medicine study is positive for abnormal activity localizing to the region of the gallbladder fossa and likely representing contained bile leak. As above, the previously placed endoscopic biliary stent appears to lie more inferiorly and medially compared to ERCP images and the stent may have migrated into the duodenum.  Findings were discussed with Dr. Zella Richer by telephone.   Electronically Signed   By: Aletta Edouard M.D.   On: 10/17/2013 13:45   Ct Abdomen Pelvis W Contrast  10/27/2013   CLINICAL DATA:  Status post partial right hepatectomy with enucleation of hemangioma and cholecystectomy 09/30/2013  complicated by postoperative bile leak and endoscopic stenting.  EXAM: CT ABDOMEN AND PELVIS WITH CONTRAST  TECHNIQUE: Multidetector CT imaging of the abdomen and pelvis was performed using the standard protocol following bolus administration of intravenous contrast.  CONTRAST:  129mL OMNIPAQUE IOHEXOL 300 MG/ML  SOLN  COMPARISON:  ERCP 10/18/2013 and MRCP 08/03/2013. Images from outside CT 06/05/2011 -no report.  FINDINGS: Lung bases: There is a moderate-sized dependent right pleural effusion which may be partially loculated. There is subtotal collapse of the right lower lobe. There is trace pleural fluid and basilar atelectasis on the left. No significant pericardial effusion.   Liver/Biliary/Pancreas: Patient has undergone partial right hepatectomy and cholecystectomy. Within the cholecystectomy bed, there is a tubular shaped fluid collection measuring 4.6 x 1.9 cm transverse. There is a surgical drain posterior to the liver which extends superiorly over the dome. This traverses a collection of fluid and air posterior to the liver which measures 5.4 x 7.1 cm transverse and approximately 12.7 cm cephalocaudad. There is a smaller fluid collection in the subhepatic space which measures 3.5 cm on image 36. No other fluid collections are demonstrated. There is no residual hemangioma within the right lobe. Plastic biliary stent is well positioned. The biliary system is decompressed, and pneumobilia is present. The pancreas appears unremarkable.  Spleen/Adrenal glands: Unremarkable.  Kidneys/Ureters/Bladder: Both kidneys appear normal. There is no hydronephrosis or evidence of urinary tract calculus. The bladder appears normal.  Bowel/Peritoneum: The stomach, small bowel, appendix and colon demonstrate no significant findings. There is stool throughout the colon. A small amount of free pelvic fluid is present.  Retroperitoneum/Pelvis: Small retroperitoneal lymph nodes are likely reactive. There is mild aortoiliac atherosclerosis. The uterus and ovaries appear normal.  Abdominal wall: Postsurgical changes are present within the anterior abdominal wall. There is no focal fluid collection or hernia.  Musculoskeletal: No acute or significant osseus findings.  IMPRESSION: 1. Large air-fluid collection posterior to the liver, likely a biloma. The surgical drain his along the posterior aspect of this collection. 2. There is a smaller fluid collection within the cholecystectomy bed which is nonspecific. There is also a small amount fluid within the subhepatic space. 3. Decompressed biliary system and pneumobilia status post biliary stent placement. 4. Right pleural effusion with associated subtotal right  lower lobe pulmonary collapse.   Electronically Signed   By: Camie Patience M.D.   On: 10/27/2013 15:44   Dg Chest Port 1 View  10/06/2013   CLINICAL DATA:  Status post ERCP.  Cough.  EXAM: PORTABLE CHEST - 1 VIEW  COMPARISON:  Single view of the chest 10/01/2013 and 09/27/2013.  FINDINGS: Right pleural effusion and basilar airspace disease persist. There has been some increase in left basilar opacity which is much milder than that seen on the right. No pneumothorax is identified. Heart size is upper normal. Drainage catheter projecting in the right upper quadrant of the abdomen is noted.  IMPRESSION: Right effusion and basilar airspace disease without marked change. Milder degree of left basilar airspace opacity has worsened. Airspace disease could be due to atelectasis or pneumonia.   Electronically Signed   By: Inge Rise M.D.   On: 10/06/2013 15:15   Dg Chest Port 1 View  10/01/2013   CLINICAL DATA:  Shortness of breath  EXAM: PORTABLE CHEST - 1 VIEW  COMPARISON:  PA and lateral chest of September 27, 2013  FINDINGS: There is new increased density in volume loss on the right. The hemidiaphragm is obscured. On the left the  lung is less well inflated today and there is minimal subsegmental atelectasis at the lung base. The cardiac silhouette is not enlarged. The pulmonary vascularity is not engorged.  IMPRESSION: Volume loss on the right with increased basilar density is consistent with atelectasis or pneumonia with pleural fluid. There is no definite evidence of CHF. This reflects a marked deterioration since the study 4 days ago. Marked change over the preceding 4 days.   Electronically Signed   By: David  Martinique   On: 10/01/2013 15:37   Dg Ercp  10/18/2013   CLINICAL DATA:  Bile leak.  EXAM: ERCP  TECHNIQUE: Multiple spot images obtained with the fluoroscopic device and submitted for interpretation post-procedure.  COMPARISON:  10/06/2013  FINDINGS: Five fluoroscopic spot images over the right upper  quadrant were obtained. Exam demonstrates cannulation of the common bile duct with contrast opacification of the common bile duct and intrahepatic ducts. There is no evidence of stricture, focal filling defect or extravasation of contrast. Multiple surgical clips are present over the right upper quadrant. Recommend correlation with findings at the time of the procedure.  IMPRESSION: Opacification of the common bile duct and central intrahepatic ducts without focal abnormality or extravasation of contrast. Recommend correlation with findings at the time of the procedure.  These images were submitted for radiologic interpretation only. Please see the procedural report for the amount of contrast and the fluoroscopy time utilized.   Electronically Signed   By: Marin Olp M.D.   On: 10/18/2013 14:48   Dg Ercp Biliary & Pancreatic Ducts  10/06/2013   CLINICAL DATA:  bilary leak  EXAM: ERCP  TECHNIQUE: Multiple spot images obtained with the fluoroscopic device and submitted for interpretation post-procedure.  COMPARISON:  MRCP 08/03/2013  FINDINGS: A series of 5 fluoroscopic spot images document endoscopic cannulation and opacification of the CBD which is nondilated. There is incomplete opacification of central intrahepatic bile ducts which appear decompressed. Some probable extravasation is noted lateral to the biliary confluence. Final image documents placement of a plastic biliary stent in expected location.  IMPRESSION: 1. Endoscopic biliary stenting.  These images were submitted for radiologic interpretation only. Please see the procedural report for the amount of contrast and the fluoroscopy time utilized.   Electronically Signed   By: Arne Cleveland M.D.   On: 10/06/2013 15:08   Dg Abd Acute W/chest  10/17/2013   CLINICAL DATA:  Abdominal distension. History of partial hepatectomy.  EXAM: ACUTE ABDOMEN SERIES (ABDOMEN 2 VIEW & CHEST 1 VIEW)  COMPARISON:  Chest radiograph- 10/11/2013 ; 09/16/2013  FINDINGS:  Grossly unchanged cardiac silhouette and mediastinal contours with obscuration of the right heart border secondary to unchanged small right-sided pleural effusion. Minimally improved aeration of lung bases with persistent bibasilar opacities, right greater than left. No new focal airspace opacities. No pneumothorax.  A surgical drain and multiple surgical clips overlies the right upper abdominal quadrant. The biliary stent overlies expected location of the common bile duct.  Moderate colonic stool burden without evidence of obstruction. No pneumoperitoneum, pneumatosis or portal venous gas.  No acute osseus abnormalities.  IMPRESSION: 1. Moderate colonic stool burden without evidence of obstruction. 2. Postsurgical change of the right upper abdominal quadrant compatible with provided history of partial hepatectomy. 3. A biliary stent overlies expected location of the CBD. 4. Unchanged small right-sided effusion with associated bibasilar opacities, right greater than left, likely atelectasis.   Electronically Signed   By: Sandi Mariscal M.D.   On: 10/17/2013 10:16    Labs:  Lab Results  Component Value Date   WBC 13.6* 10/28/2013   HCT 38.8 10/28/2013   MCV 86.6 10/28/2013   PLT 333 10/28/2013   NA 142 10/17/2013   K 4.1 10/17/2013   CL 105 10/17/2013   CO2 23 10/17/2013   GLUCOSE 99 10/17/2013   BUN 11 10/17/2013   CREATININE 0.61 10/17/2013   CALCIUM 9.2 10/17/2013   PROT 7.0 10/17/2013   ALBUMIN 2.8* 10/17/2013   AST 18 10/17/2013   ALT 34 10/17/2013   ALKPHOS 119* 10/17/2013   BILITOT 0.5 10/17/2013   GFRNONAA >90 10/17/2013   GFRAA >90 10/17/2013   INR 1.30 10/28/2013    Assessment and Plan:  Partial hepatectomy and cholecystotomy 09/30/13 Post surgical abd drain intact Post Bile leak---stent placed Now with large Rt abd fluid collection and Rt pleural effusion Scheduled for R thoracentesis and Rt abd fluid collection drain placement Pt and family aware of procedure benefits and risks and agreeable to proceed Consent  signed and in chart  Thank you for this interesting consult.  I greatly enjoyed meeting Aneyah Lortz and look forward to participating in their care.    Lavonia Drafts PAC-IR   I spent a total of 30 minutes face to face in clinical consultation, greater than 50% of which was counseling/coordinating care for Rt thoracentesis and Rt abdominal fluid collection drain placement

## 2013-10-29 ENCOUNTER — Encounter (HOSPITAL_COMMUNITY): Payer: Self-pay

## 2013-10-29 MED ORDER — SENNOSIDES-DOCUSATE SODIUM 8.6-50 MG PO TABS
1.0000 | ORAL_TABLET | Freq: Every day | ORAL | Status: DC
  Administered 2013-10-29 – 2013-11-02 (×5): 1 via ORAL
  Filled 2013-10-29 (×5): qty 1

## 2013-10-29 MED ORDER — BISACODYL 10 MG RE SUPP
10.0000 mg | Freq: Every day | RECTAL | Status: DC
  Administered 2013-10-29 – 2013-11-02 (×2): 10 mg via RECTAL
  Filled 2013-10-29 (×3): qty 1

## 2013-10-29 MED ORDER — PANTOPRAZOLE SODIUM 40 MG PO TBEC
40.0000 mg | DELAYED_RELEASE_TABLET | Freq: Every day | ORAL | Status: DC
  Administered 2013-10-29 – 2013-11-01 (×4): 40 mg via ORAL
  Filled 2013-10-29 (×5): qty 1

## 2013-10-29 NOTE — Progress Notes (Signed)
Interpreter Lesle Chris for [facility] admitting

## 2013-10-29 NOTE — H&P (Signed)
Deborah Trujillo is an 48 y.o. female.   Chief Complaint: fevers HPI:  Pt admitted with fevers at home and change in drain character from known bile leak.  CT demonstrated fluid collection with A-F level inadequately drained by surgical drain.  Comes in after undergoing thoracentesis and right upper quadrant drain placement.    Past Medical History  Diagnosis Date  . Liver mass   . Hyperlipidemia   . Pain     PAIN - BURNING SENSATION RIGHT SIDE - BLOOD WORK ABNORMAL - FOUND TO HAVE LIVER MASS  . Colitis     Past Surgical History  Procedure Laterality Date  . Neck mass excision  2009    absess removed   . Tubal ligation    . Laparoscopic partial hepatectomy Right 09/30/2013    Procedure: PARTIAL RIGHT HEPATECTOMY; CHOLECYSTECTOMY;  Surgeon: Stark Klein, MD;  Location: [facility];  Service: General;  Laterality: Right;  . Ercp N/A 10/06/2013    Procedure: ENDOSCOPIC RETROGRADE CHOLANGIOPANCREATOGRAPHY (ERCP);  Surgeon: Arta Silence, MD;  Location: [facility];  Service: Endoscopy;  Laterality: N/A;  . Ercp N/A 10/18/2013    Procedure: ENDOSCOPIC RETROGRADE CHOLANGIOPANCREATOGRAPHY (ERCP);  Surgeon: Jeryl Columbia, MD;  Location: [facility];  Service: Endoscopy;  Laterality: N/A;    Family History  Problem Relation Age of Onset  . Diabetes Mother    Social History:  reports that she has never smoked. She has never used smokeless tobacco. She reports that she does not drink alcohol or use illicit drugs.  Allergies: No Known Allergies  Medications Prior to Admission  Medication Sig Dispense Refill  . amoxicillin-clavulanate (AUGMENTIN) 875-125 MG per tablet Take 1 tablet by mouth every 12 (twelve) hours.  14 tablet  0  . colestipol (COLESTID) 1 G tablet Take 2 tablets (2 g total) by mouth 3 (three) times daily.  90 tablet  1  . omeprazole (PRILOSEC) 20 MG capsule Take 1 capsule (20 mg total) by mouth daily.  30 capsule  0  . oxyCODONE (OXY IR/ROXICODONE) 5 MG immediate release tablet  Take 1-2 tablets (5-10 mg total) by mouth every 4 (four) hours as needed for moderate pain, severe pain or breakthrough pain.  50 tablet  0    Results for orders placed during the hospital encounter of 10/28/13 (from the past 48 hour(s))  COMPREHENSIVE METABOLIC PANEL     Status: Abnormal   Collection Time    10/28/13  9:43 PM      Result Value Ref Range   Sodium 135 (*) 137 - 147 mEq/L   Potassium 4.2  3.7 - 5.3 mEq/L   Chloride 98  96 - 112 mEq/L   CO2 21  19 - 32 mEq/L   Glucose, Bld 112 (*) 70 - 99 mg/dL   BUN 8  6 - 23 mg/dL   Creatinine, Ser 0.55  0.50 - 1.10 mg/dL   Calcium 8.3 (*) 8.4 - 10.5 mg/dL   Total Protein 6.1  6.0 - 8.3 g/dL   Albumin 2.1 (*) 3.5 - 5.2 g/dL   AST 15  0 - 37 U/L   ALT 12  0 - 35 U/L   Alkaline Phosphatase 90  39 - 117 U/L   Total Bilirubin 0.4  0.3 - 1.2 mg/dL   GFR calc non Af Amer >90  >90 mL/min   GFR calc Af Amer >90  >90 mL/min   Comment: (NOTE)     The eGFR has been calculated using the CKD EPI equation.  This calculation has not been validated in all clinical situations.     eGFR's persistently <90 mL/min signify possible Chronic Kidney     Disease.   Anion gap 16 (*) 5 - 15  PROTIME-INR     Status: Abnormal   Collection Time    10/28/13  9:43 PM      Result Value Ref Range   Prothrombin Time 16.5 (*) 11.6 - 15.2 seconds   INR 1.33  0.00 - 1.49  MAGNESIUM     Status: None   Collection Time    10/28/13  9:43 PM      Result Value Ref Range   Magnesium 1.6  1.5 - 2.5 mg/dL  PHOSPHORUS     Status: None   Collection Time    10/28/13  9:43 PM      Result Value Ref Range   Phosphorus 3.3  2.3 - 4.6 mg/dL  CBC WITH DIFFERENTIAL     Status: Abnormal   Collection Time    10/28/13  9:43 PM      Result Value Ref Range   WBC 10.9 (*) 4.0 - 10.5 K/uL   RBC 4.03  3.87 - 5.11 MIL/uL   Hemoglobin 11.4 (*) 12.0 - 15.0 g/dL   HCT 34.8 (*) 36.0 - 46.0 %   MCV 86.4  78.0 - 100.0 fL   MCH 28.3  26.0 - 34.0 pg   MCHC 32.8  30.0 - 36.0 g/dL    RDW 14.6  11.5 - 15.5 %   Platelets 290  150 - 400 K/uL   Neutrophils Relative % 84 (*) 43 - 77 %   Neutro Abs 9.1 (*) 1.7 - 7.7 K/uL   Lymphocytes Relative 9 (*) 12 - 46 %   Lymphs Abs 1.0  0.7 - 4.0 K/uL   Monocytes Relative 7  3 - 12 %   Monocytes Absolute 0.8  0.1 - 1.0 K/uL   Eosinophils Relative 0  0 - 5 %   Eosinophils Absolute 0.0  0.0 - 0.7 K/uL   Basophils Relative 0  0 - 1 %   Basophils Absolute 0.0  0.0 - 0.1 K/uL   Ct Abdomen Wo Contrast  10/28/2013   CLINICAL DATA:  48 year old female status post liver hemangioma resection. She has had increasing abdominal pain and fevers at home. Recent CT scan demonstrates intra abdominal fluid collection which is suspicious for a bile leak on a nuclear medicine study.  The patient has undergone right thoracentesis as well as ultrasound-guided drainage of the abdominal fluid on today's date.  EXAM: CT ABDOMEN WITHOUT CONTRAST  TECHNIQUE: Multidetector CT imaging of the abdomen was performed following the standard protocol without IV contrast.  COMPARISON:  Abdominal CT 10/27/2013  FINDINGS: Lower chest:  Unremarkable appearance of the skin subcutaneous tissue the chest. Surgical changes of prior abdominal laparotomy.  Percutaneous drain in the intercostal space of the right ninth and tenth ribs with subcutaneous gas associated with recent placement.  Heart size within normal limits. Minimal pericardial fluid/thickening.  Compared to the prior CT there has been evacuation of the right-sided pleural fluid. Associated atelectatic changes are present at the right base. Minimal atelectasis at the will left base.  Abdomen:  Fluid and gas collection within the left upper abdomen measures approximately 5.6 cm x 4.7 cm on today's date. Surgical changes are present along the margin of the fluid collection compatible the re- since prior liver surgery. The percutaneous drain traverses the fluid collection and terminates in the hilum  of the liver.  Surgical drain  from the anterior abdomen terminates at the dome of the liver on the lateral aspect of fluid collection.  The separate fluid collection within the right lobe of the liver measures 19 mm x 41 mm on current study, similar to comparison CT.  Biliary stent appears to have migrated into the duodenum as compared to the prior CT.  No abnormally distended small bowel or colon. Retained oral contrast within the colon.  No evidence of free intraperitoneal air.  No displaced fracture.  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  Surgical changes of prior right liver lobe hemangioma resection, with surgical drain from the anterior abdomen and soft tissue changes of the right upper quadrant.  Interval resolution of right-sided pleural fluid status post right thoracentesis on today's date.  Biliary stent has migrated into the duodenum.  Signed,  Dulcy Fanny. Earleen Newport, DO  [facility]  [facility]   Electronically Signed   By: Corrie Mckusick O.D.   On: 10/28/2013 17:37   Ct Abdomen Pelvis W Contrast  10/27/2013   CLINICAL DATA:  Status post partial right hepatectomy with enucleation of hemangioma and cholecystectomy 01/60/1093 complicated by postoperative bile leak and endoscopic stenting.  EXAM: CT ABDOMEN AND PELVIS WITH CONTRAST  TECHNIQUE: Multidetector CT imaging of the abdomen and pelvis was performed using the standard protocol following bolus administration of intravenous contrast.  CONTRAST:  134m OMNIPAQUE IOHEXOL 300 MG/ML  SOLN  COMPARISON:  ERCP 10/18/2013 and MRCP 08/03/2013. Images from outside CT 06/05/2011 -no report.  FINDINGS: Lung bases: There is a moderate-sized dependent right pleural effusion which may be partially loculated. There is subtotal collapse of the right lower lobe. There is trace pleural fluid and basilar atelectasis on the left. No  significant pericardial effusion.  Liver/Biliary/Pancreas: Patient has undergone partial right hepatectomy and cholecystectomy. Within the cholecystectomy bed, there is a tubular shaped fluid collection measuring 4.6 x 1.9 cm transverse. There is a surgical drain posterior to the liver which extends superiorly over the dome. This traverses a collection of fluid and air posterior to the liver which measures 5.4 x 7.1 cm transverse and approximately 12.7 cm cephalocaudad. There is a smaller fluid collection in the subhepatic space which measures 3.5 cm on image 36. No other fluid collections are demonstrated. There is no residual hemangioma within the right lobe. Plastic biliary stent is well positioned. The biliary system is decompressed, and pneumobilia is present. The pancreas appears unremarkable.  Spleen/Adrenal glands: Unremarkable.  Kidneys/Ureters/Bladder: Both kidneys appear normal. There is no hydronephrosis or evidence of urinary tract calculus. The bladder appears normal.  Bowel/Peritoneum: The stomach, small bowel, appendix and colon demonstrate no significant findings. There is stool throughout the colon. A small amount of free pelvic fluid is present.  Retroperitoneum/Pelvis: Small retroperitoneal lymph nodes are likely reactive. There is mild aortoiliac atherosclerosis. The uterus and ovaries appear normal.  Abdominal wall: Postsurgical changes are present within the anterior abdominal wall. There is no focal fluid collection or hernia.  Musculoskeletal: No acute or significant osseus findings.  IMPRESSION: 1. Large air-fluid collection posterior to the liver, likely a biloma. The surgical drain his along the posterior aspect of this collection. 2. There is a smaller fluid collection within the cholecystectomy bed which is nonspecific. There is also a small amount fluid within the subhepatic space. 3. Decompressed biliary system and pneumobilia status post biliary stent  placement. 4. Right pleural  effusion with associated subtotal right lower lobe pulmonary collapse.   Electronically Signed   By: Camie Patience M.D.   On: 10/27/2013 15:44   US Aspiration  10/28/2013   INDICATION: 48 year old female status post right liver lobe hemangioma resection. She has developed fevers and pain at home, with concern for right upper quadrant biliary leak. She has been referred for drainage.  EXAM: US ASPIRATION  COMPARISON:  CT 10/27/2013.  Nuclear medicine study 10/17/2013  MEDICATIONS: The patient is currently admitted to the hospital and receiving intravenous antibiotics. The antibiotics were administered within an appropriate time frame prior to the initiation of the procedure.  ANESTHESIA/SEDATION: Fentanyl 100 mcg IV; Versed 2 mg IV  Total Moderate Sedation time  Thirty minutes  CONTRAST:  None  COMPLICATIONS: None immediate  PROCEDURE: Informed written consent was obtained from the patient and the patient's family after a discussion of the risks, benefits and alternatives to treatment. The patient was placed in the left decubitus position on the ultrasound table and a ultrasound survey was completed of the right upper quadrant with images stored and sent to PACs. The procedure was planned. A timeout was performed prior to the initiation of the procedure.  The right flank was prepped and draped in the usual sterile fashion. The overlying soft tissues were anesthetized with 1% lidocaine with epinephrine. An 18 gauge trocar needle was advanced into the right upper quadrant fluid collection adjacent to the liver under ultrasound guidance, and position was confirmed with aspiration of bilious fluid. A short Amplatz super stiff wire was then advanced into the collection.  The tract was serially dilated allowing placement of a 10 [place] all-purpose drainage catheter.  5 cc of ileus fluid was then aspirated. The tube was connected to a bulb suction and sutured in place. A dressing was placed. The patient tolerated the  procedure well without immediate post procedural complication.  IMPRESSION: Status post ultrasound-guided drainage of right upper quadrant fluid collection with 10 French pigtail drain. Sample of fluid was sent to the lab for complete analysis.  Signed,  Dulcy Fanny. Earleen Newport, DO  [facility]  [facility]   Electronically Signed   By: Corrie Mckusick O.D.   On: 10/28/2013 17:50   Ct Image Guided Fluid Drain By Catheter  10/28/2013   CLINICAL DATA:  48 year old female with a history of right liver lobe hemangioma resection. She has developed fevers and suspicion bile leak.  EXAM: CT GUIDED drainage of abdominal fluid collection  ANESTHESIA/SEDATION: One  Mg IV Versed; 25 mcg IV Fentanyl  Total Moderate Sedation Time: 12 minutes.  PROCEDURE: The procedure risks, benefits, and alternatives were explained to the patient. Questions regarding the procedure were encouraged and answered. The patient understands and consents to the procedure.  The patient was positioned and left decubitus on the CT gantry table. Scout CT of the abdomen was performed.  The right flank in the indwelling drain was prepped with Betadinein a sterile fashion, and a sterile drape was applied covering the operative field. A sterile gown and sterile gloves were used for the procedure. Local anesthesia was provided with 1% Lidocaine.  The indwelling drain was cut and a Amplatz wire was advanced to the drain and position. A CT image confirmed position of the wire and drain within the right upper quadrant fluid collection. The drain was then exchanged for a new 10 French 25 cm pigtail catheter.  Again, CT image confirmed a new drain in  position within this fluid collection. Approximately 30 cc of yellowish fluid was aspirated and sent for analysis.  The drain was then sutured in position with a single Prolene retention suture as well as a StatLock.  Complications: None  FINDINGS: Indwelling drain, previously  placed with ultrasound, with radiopaque marker outside of the abdominal fascia.  Images during the case demonstrate modified Seldinger technique for exchange of the drain.  Final image demonstrates a new 10.2 French pigtail catheter and right upper quadrant fluid collection.  30 cc of thick yellowish fluid was aspirated.  IMPRESSION: Status post CT-guided drain exchange of right upper quadrant fluid collection. Sample of fluid sent to lab for analysis.  Signed,  Dulcy Fanny. Earleen Newport, DO  [facility]  [facility]   Electronically Signed   By: Corrie Mckusick O.D.   On: 10/28/2013 18:29   US Thoracentesis Asp Pleural Space W/img Guide  10/28/2013   CLINICAL DATA:  48 year old female status post liver tumor resection. She has developed fever and abdominal pain at home. Recent CT demonstrates right-sided pleural effusion as well as right upper quadrant fluid collection.  EXAM: ULTRASOUND GUIDED right THORACENTESIS  COMPARISON:  CT of the abdomen 10/27/2013.  PROCEDURE: An ultrasound guided thoracentesis was thoroughly discussed with the patient and questions answered. The benefits, risks, alternatives and complications were also discussed. The patient understands and wishes to proceed with the procedure. Written consent was obtained.  Ultrasound was performed to localize and mark an adequate pocket of fluid in the right chest. The area was then prepped and draped in the normal sterile fashion. 1% Lidocaine was used for local anesthesia. Under ultrasound guidance a 19 gauge Yueh catheter was introduced. Thoracentesis was performed. The catheter was removed and a dressing applied.  Complications:  None  FINDINGS: A total of approximately 450 cc of yellowish fluid was removed. A fluid sample wassent for laboratory analysis.  IMPRESSION: Status post right-sided thoracentesis with ultrasound guidance yielding 450 cc of yellowish fluid. A sample was sent the lab for analysis.   Signed,  Dulcy Fanny. Earleen Newport, DO  [facility]  [facility]   Electronically Signed   By: Corrie Mckusick O.D.   On: 10/28/2013 17:44    Review of Systems  All other systems reviewed and are negative.   Blood pressure 94/52, pulse 83, temperature 98.1 F (36.7 C), temperature source Oral, resp. rate 16, height _0  (1.499 m), weight 152 lb 9.6 oz (69.219 kg), SpO2 93.00%. Physical Exam  Constitutional: She is oriented to person, place, and time. She appears well-developed and well-nourished. She appears distressed (mild).  HENT:  Head: Normocephalic and atraumatic.  Eyes: Conjunctivae are normal. Pupils are equal, round, and reactive to light. No scleral icterus.  Cardiovascular: Normal rate and intact distal pulses.   Respiratory: Effort normal.  GI: Soft. She exhibits no distension. There is tenderness (at drain sites.). There is no rebound.  Drains with thick yellow drainage.     Musculoskeletal: Normal range of motion.  Neurological: She is alert and oriented to person, place, and time.  Skin: Skin is warm and dry. No rash noted. She is not diaphoretic. No erythema. No pallor.  Psychiatric: She has a normal mood and affect. Her behavior is normal. Judgment and thought content normal.     Assessment/Plan Biloma R pleural effusion  Doing better but still with pain and low grade temps. Will do IV antibiotics and pain control for 1-2 days.   Elpidia Karn 10/29/2013,  11:18 AM

## 2013-10-29 NOTE — Care Management Note (Signed)
  Page 1 of 1   10/29/2013     2:49:30 PM CARE MANAGEMENT NOTE 10/29/2013  Patient:  Deborah Trujillo, Deborah Trujillo   Account Number:  [id number]  Date Initiated:  10/29/2013  Documentation initiated by:  Magdalen Spatz  Subjective/Objective Assessment:     Action/Plan:   Anticipated DC Date:     Anticipated DC Plan:           Choice offered to / List presented to:             Status of service:   Medicare Important Message given?   (If response is "NO", the following Medicare IM given date fields will be blank) Date Medicare IM given:   Medicare IM given by:   Date Additional Medicare IM given:   Additional Medicare IM given by:    Discharge Disposition:    Per UR Regulation:    If discussed at Long Length of Stay Meetings, dates discussed:    Comments:  10-29-13 Referral for medication assitance.  Confirmed with patient's family , patient has an orange card which helps with medications , therefore NCM unable to assist . Family aware.  Magdalen Spatz RN BSN

## 2013-10-29 NOTE — Progress Notes (Signed)
Referring Physician(s): Dr. Barry Dienes  Subjective: Patient c/o right flank pain with drain. She does state she feels better today than yesterday.   Allergies: Review of patient's allergies indicates no known allergies.  Medications: Prior to Admission medications   Medication Sig Start Date End Date Taking? Authorizing Provider  amoxicillin-clavulanate (AUGMENTIN) 875-125 MG per tablet Take 1 tablet by mouth every 12 (twelve) hours. 10/21/13  Yes Stark Klein, MD  colestipol (COLESTID) 1 G tablet Take 2 tablets (2 g total) by mouth 3 (three) times daily. 10/12/13  Yes Stark Klein, MD  omeprazole (PRILOSEC) 20 MG capsule Take 1 capsule (20 mg total) by mouth daily. 10/21/13  Yes Stark Klein, MD  oxyCODONE (OXY IR/ROXICODONE) 5 MG immediate release tablet Take 1-2 tablets (5-10 mg total) by mouth every 4 (four) hours as needed for moderate pain, severe pain or breakthrough pain. 10/21/13  Yes Stark Klein, MD   Review of Systems  Vital Signs: BP 104/62  Pulse 97  Temp(Src) 98.1 F (36.7 C) (Oral)  Resp 18  Ht 4' 11"  (1.499 m)  Wt 152 lb 9.6 oz (69.219 kg)  BMI 30.81 kg/m2  SpO2 93%  Physical Exam General: A&Ox3, NAD Abd: Soft, tenderness right flank, Right drain 20 cc /24 hour output green/yellow output, Right flank perc drain 58 cc /24 hr output green/yellow output, Cx no growth   Imaging: Ct Abdomen Wo Contrast  10/28/2013   CLINICAL DATA:  48 year old female status post liver hemangioma resection. She has had increasing abdominal pain and fevers at home. Recent CT scan demonstrates intra abdominal fluid collection which is suspicious for a bile leak on a nuclear medicine study.  The patient has undergone right thoracentesis as well as ultrasound-guided drainage of the abdominal fluid on today's date.  EXAM: CT ABDOMEN WITHOUT CONTRAST  TECHNIQUE: Multidetector CT imaging of the abdomen was performed following the standard protocol without IV contrast.  COMPARISON:  Abdominal CT  10/27/2013  FINDINGS: Lower chest:  Unremarkable appearance of the skin subcutaneous tissue the chest. Surgical changes of prior abdominal laparotomy.  Percutaneous drain in the intercostal space of the right ninth and tenth ribs with subcutaneous gas associated with recent placement.  Heart size within normal limits. Minimal pericardial fluid/thickening.  Compared to the prior CT there has been evacuation of the right-sided pleural fluid. Associated atelectatic changes are present at the right base. Minimal atelectasis at the will left base.  Abdomen:  Fluid and gas collection within the left upper abdomen measures approximately 5.6 cm x 4.7 cm on today's date. Surgical changes are present along the margin of the fluid collection compatible the re- since prior liver surgery. The percutaneous drain traverses the fluid collection and terminates in the hilum of the liver.  Surgical drain from the anterior abdomen terminates at the dome of the liver on the lateral aspect of fluid collection.  The separate fluid collection within the right lobe of the liver measures 19 mm x 41 mm on current study, similar to comparison CT.  Biliary stent appears to have migrated into the duodenum as compared to the prior CT.  No abnormally distended small bowel or colon. Retained oral contrast within the colon.  No evidence of free intraperitoneal air.  No displaced fracture.  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  Surgical changes of prior right liver lobe hemangioma resection, with surgical drain from the anterior abdomen and soft tissue changes of  the right upper quadrant.  Interval resolution of right-sided pleural fluid status post right thoracentesis on today's date.  Biliary stent has migrated into the duodenum.  Signed,  Dulcy Fanny. Earleen Newport, DO  [facility]  [facility]  Radiology   Electronically Signed   By: Corrie Mckusick O.D.   On: 10/28/2013 17:37   Ct Abdomen Pelvis W Contrast  10/27/2013   CLINICAL DATA:  Status post partial right hepatectomy with enucleation of hemangioma and cholecystectomy 76/28/3151 complicated by postoperative bile leak and endoscopic stenting.  EXAM: CT ABDOMEN AND PELVIS WITH CONTRAST  TECHNIQUE: Multidetector CT imaging of the abdomen and pelvis was performed using the standard protocol following bolus administration of intravenous contrast.  CONTRAST:  133m OMNIPAQUE IOHEXOL 300 MG/ML  SOLN  COMPARISON:  ERCP 10/18/2013 and MRCP 08/03/2013. Images from outside CT 06/05/2011 -no report.  FINDINGS: Lung bases: There is a moderate-sized dependent right pleural effusion which may be partially loculated. There is subtotal collapse of the right lower lobe. There is trace pleural fluid and basilar atelectasis on the left. No significant pericardial effusion.  Liver/Biliary/Pancreas: Patient has undergone partial right hepatectomy and cholecystectomy. Within the cholecystectomy bed, there is a tubular shaped fluid collection measuring 4.6 x 1.9 cm transverse. There is a surgical drain posterior to the liver which extends superiorly over the dome. This traverses a collection of fluid and air posterior to the liver which measures 5.4 x 7.1 cm transverse and approximately 12.7 cm cephalocaudad. There is a smaller fluid collection in the subhepatic space which measures 3.5 cm on image 36. No other fluid collections are demonstrated. There is no residual hemangioma within the right lobe. Plastic biliary stent is well positioned. The biliary system is decompressed, and pneumobilia is present. The pancreas appears unremarkable.  Spleen/Adrenal glands: Unremarkable.  Kidneys/Ureters/Bladder: Both kidneys appear normal. There is no hydronephrosis or evidence of urinary tract calculus. The bladder appears normal.  Bowel/Peritoneum: The stomach, small bowel,  appendix and colon demonstrate no significant findings. There is stool throughout the colon. A small amount of free pelvic fluid is present.  Retroperitoneum/Pelvis: Small retroperitoneal lymph nodes are likely reactive. There is mild aortoiliac atherosclerosis. The uterus and ovaries appear normal.  Abdominal wall: Postsurgical changes are present within the anterior abdominal wall. There is no focal fluid collection or hernia.  Musculoskeletal: No acute or significant osseus findings.  IMPRESSION: 1. Large air-fluid collection posterior to the liver, likely a biloma. The surgical drain his along the posterior aspect of this collection. 2. There is a smaller fluid collection within the cholecystectomy bed which is nonspecific. There is also a small amount fluid within the subhepatic space. 3. Decompressed biliary system and pneumobilia status post biliary stent placement. 4. Right pleural effusion with associated subtotal right lower lobe pulmonary collapse.   Electronically Signed   By: BCamie PatienceM.D.   On: 10/27/2013 15:44   [place]  10/28/2013   INDICATION: 48year old female status post right liver lobe hemangioma resection. She has developed fevers and pain at home, with concern for right upper quadrant biliary leak. She has been referred for drainage.  EXAM: [place]  COMPARISON:  CT 10/27/2013.  Nuclear medicine study 10/17/2013  MEDICATIONS: The patient is currently admitted to the hospital and receiving intravenous antibiotics. The antibiotics were administered within an appropriate time frame prior to the initiation of the procedure.  ANESTHESIA/SEDATION: Fentanyl 100 mcg IV; Versed 2 mg IV  Total Moderate Sedation time  Thirty minutes  CONTRAST:  None  COMPLICATIONS: None immediate  PROCEDURE: Informed written consent was obtained from the patient and the patient's family after a discussion of the risks, benefits and alternatives to treatment. The patient was placed in the left decubitus  position on the ultrasound table and a ultrasound survey was completed of the right upper quadrant with images stored and sent to PACs. The procedure was planned. A timeout was performed prior to the initiation of the procedure.  The right flank was prepped and draped in the usual sterile fashion. The overlying soft tissues were anesthetized with 1% lidocaine with epinephrine. An 18 gauge trocar needle was advanced into the right upper quadrant fluid collection adjacent to the liver under ultrasound guidance, and position was confirmed with aspiration of bilious fluid. A short Amplatz super stiff wire was then advanced into the collection.  The tract was serially dilated allowing placement of a 10 [place] all-purpose drainage catheter.  5 cc of ileus fluid was then aspirated. The tube was connected to a bulb suction and sutured in place. A dressing was placed. The patient tolerated the procedure well without immediate post procedural complication.  IMPRESSION: Status post ultrasound-guided drainage of right upper quadrant fluid collection with 10 French pigtail drain. Sample of fluid was sent to the lab for complete analysis.  Signed,  Dulcy Fanny. Earleen Newport, DO  [facility]  [facility] Radiology   Electronically Signed   By: Corrie Mckusick O.D.   On: 10/28/2013 17:50   Ct Image Guided Fluid Drain By Catheter  10/28/2013   CLINICAL DATA:  48 year old female with a history of right liver lobe hemangioma resection. She has developed fevers and suspicion bile leak.  EXAM: CT GUIDED drainage of abdominal fluid collection  ANESTHESIA/SEDATION: One  Mg IV Versed; 25 mcg IV Fentanyl  Total Moderate Sedation Time: 12 minutes.  PROCEDURE: The procedure risks, benefits, and alternatives were explained to the patient. Questions regarding the procedure were encouraged and answered. The patient understands and consents to the procedure.  The patient was positioned and left decubitus on the CT  gantry table. Scout CT of the abdomen was performed.  The right flank in the indwelling drain was prepped with Betadinein a sterile fashion, and a sterile drape was applied covering the operative field. A sterile gown and sterile gloves were used for the procedure. Local anesthesia was provided with 1% Lidocaine.  The indwelling drain was cut and a Amplatz wire was advanced to the drain and position. A CT image confirmed position of the wire and drain within the right upper quadrant fluid collection. The drain was then exchanged for a new 10 French 25 cm pigtail catheter.  Again, CT image confirmed a new drain in position within this fluid collection. Approximately 30 cc of yellowish fluid was aspirated and sent for analysis.  The drain was then sutured in position with a single Prolene retention suture as well as a StatLock.  Complications: None  FINDINGS: Indwelling drain, previously placed with ultrasound, with radiopaque marker outside of the abdominal fascia.  Images during the case demonstrate modified Seldinger technique for exchange of the drain.  Final image demonstrates a new 10.2 French pigtail catheter and right upper quadrant fluid collection.  30 cc of thick yellowish fluid was aspirated.  IMPRESSION: Status post CT-guided drain exchange of right upper quadrant fluid collection. Sample of fluid sent to lab for analysis.  Signed,  Dulcy Fanny. Earleen Newport, DO  [facility]  [facility] Radiology   Electronically  Signed   By: Corrie Mckusick O.D.   On: 10/28/2013 18:29   US Thoracentesis Asp Pleural Space W/img Guide  10/28/2013   CLINICAL DATA:  48 year old female status post liver tumor resection. She has developed fever and abdominal pain at home. Recent CT demonstrates right-sided pleural effusion as well as right upper quadrant fluid collection.  EXAM: ULTRASOUND GUIDED right THORACENTESIS  COMPARISON:  CT of the abdomen 10/27/2013.  PROCEDURE: An ultrasound guided  thoracentesis was thoroughly discussed with the patient and questions answered. The benefits, risks, alternatives and complications were also discussed. The patient understands and wishes to proceed with the procedure. Written consent was obtained.  Ultrasound was performed to localize and mark an adequate pocket of fluid in the right chest. The area was then prepped and draped in the normal sterile fashion. 1% Lidocaine was used for local anesthesia. Under ultrasound guidance a 19 gauge Yueh catheter was introduced. Thoracentesis was performed. The catheter was removed and a dressing applied.  Complications:  None  FINDINGS: A total of approximately 450 cc of yellowish fluid was removed. A fluid sample wassent for laboratory analysis.  IMPRESSION: Status post right-sided thoracentesis with ultrasound guidance yielding 450 cc of yellowish fluid. A sample was sent the lab for analysis.  Signed,  Dulcy Fanny. Earleen Newport, DO  [facility]  [facility] Radiology   Electronically Signed   By: Corrie Mckusick O.D.   On: 10/28/2013 17:44    Labs: Results for orders placed during the hospital encounter of 10/28/13 (from the past 48 hour(s))  COMPREHENSIVE METABOLIC PANEL     Status: Abnormal   Collection Time    10/28/13  9:43 PM      Result Value Ref Range   Sodium 135 (*) 137 - 147 mEq/L   Potassium 4.2  3.7 - 5.3 mEq/L   Chloride 98  96 - 112 mEq/L   CO2 21  19 - 32 mEq/L   Glucose, Bld 112 (*) 70 - 99 mg/dL   BUN 8  6 - 23 mg/dL   Creatinine, Ser 0.55  0.50 - 1.10 mg/dL   Calcium 8.3 (*) 8.4 - 10.5 mg/dL   Total Protein 6.1  6.0 - 8.3 g/dL   Albumin 2.1 (*) 3.5 - 5.2 g/dL   AST 15  0 - 37 U/L   ALT 12  0 - 35 U/L   Alkaline Phosphatase 90  39 - 117 U/L   Total Bilirubin 0.4  0.3 - 1.2 mg/dL   GFR calc non Af Amer >90  >90 mL/min   GFR calc Af Amer >90  >90 mL/min   Comment: (NOTE)     The eGFR has been calculated using the CKD EPI equation.     This calculation has  not been validated in all clinical situations.     eGFR's persistently <90 mL/min signify possible Chronic Kidney     Disease.   Anion gap 16 (*) 5 - 15  PROTIME-INR     Status: Abnormal   Collection Time    10/28/13  9:43 PM      Result Value Ref Range   Prothrombin Time 16.5 (*) 11.6 - 15.2 seconds   INR 1.33  0.00 - 1.49  MAGNESIUM     Status: None   Collection Time    10/28/13  9:43 PM      Result Value Ref Range   Magnesium 1.6  1.5 - 2.5 mg/dL  PHOSPHORUS     Status: None  Collection Time    10/28/13  9:43 PM      Result Value Ref Range   Phosphorus 3.3  2.3 - 4.6 mg/dL  CBC WITH DIFFERENTIAL     Status: Abnormal   Collection Time    10/28/13  9:43 PM      Result Value Ref Range   WBC 10.9 (*) 4.0 - 10.5 K/uL   RBC 4.03  3.87 - 5.11 MIL/uL   Hemoglobin 11.4 (*) 12.0 - 15.0 g/dL   HCT 34.8 (*) 36.0 - 46.0 %   MCV 86.4  78.0 - 100.0 fL   MCH 28.3  26.0 - 34.0 pg   MCHC 32.8  30.0 - 36.0 g/dL   RDW 14.6  11.5 - 15.5 %   Platelets 290  150 - 400 K/uL   Neutrophils Relative % 84 (*) 43 - 77 %   Neutro Abs 9.1 (*) 1.7 - 7.7 K/uL   Lymphocytes Relative 9 (*) 12 - 46 %   Lymphs Abs 1.0  0.7 - 4.0 K/uL   Monocytes Relative 7  3 - 12 %   Monocytes Absolute 0.8  0.1 - 1.0 K/uL   Eosinophils Relative 0  0 - 5 %   Eosinophils Absolute 0.0  0.0 - 0.7 K/uL   Basophils Relative 0  0 - 1 %   Basophils Absolute 0.0  0.0 - 0.1 K/uL    Assessment and Plan: S/p right liver lobe hemangioma resection and cholecystectomy 09/30/13 Post operative biloma and s/p ERCP and stent placement 8/26. S/p CT guided repositioning and placement of new perc drain 9/17 Wbc trending down, continue to monitor daily output and flush drain daily.  Plans per [facility].   Tsosie Billing PA-C Interventional Radiology  10/29/13 1:51 PM   I spent a total of 15 minutes face to face in clinical consultation/evaluation, greater than 50% of which was counseling/coordinating care

## 2013-10-29 NOTE — Progress Notes (Addendum)
Subjective: Pt feeling a bit better, but now having pain at new drain site.  Also complaining of constipation.    Objective: Vital signs in last 24 hours: Temp:  [98.1 F (36.7 C)-103 F (39.4 C)] 98.1 F (36.7 C) (09/18 0558) Pulse Rate:  [80-115] 83 (09/18 0558) Resp:  [16-29] 16 (09/18 0558) BP: (93-116)/(16-78) 94/52 mmHg (09/18 0558) SpO2:  [89 %-100 %] 93 % (09/18 0558) Weight:  [143 lb 4.8 oz (65 kg)-152 lb 9.6 oz (69.219 kg)] 152 lb 9.6 oz (69.219 kg) (09/17 2010)    Intake/Output from previous day: 09/17 0701 - 09/18 0700 In: 433.3 [I.V.:433.3] Out: 328 [Urine:250; Drains:78] Intake/Output this shift:    General appearance: alert, cooperative and mild distress Resp: breathing comfortably GI: soft, non distended, approp tender.  Lab Results:   Recent Labs  10/28/13 1259 10/28/13 2143  WBC 13.6* 10.9*  HGB 12.8 11.4*  HCT 38.8 34.8*  PLT 333 290   BMET  Recent Labs  10/28/13 2143  NA 135*  K 4.2  CL 98  CO2 21  GLUCOSE 112*  BUN 8  CREATININE 0.55  CALCIUM 8.3*   PT/INR  Recent Labs  10/28/13 1259 10/28/13 2143  LABPROT 16.2* 16.5*  INR 1.30 1.33   ABG No results found for this basename: PHART, PCO2, PO2, HCO3,  in the last 72 hours  Studies/Results: Ct Abdomen Wo Contrast  10/28/2013   CLINICAL DATA:  48 year old female status post liver hemangioma resection. She has had increasing abdominal pain and fevers at home. Recent CT scan demonstrates intra abdominal fluid collection which is suspicious for a bile leak on a nuclear medicine study.  The patient has undergone right thoracentesis as well as ultrasound-guided drainage of the abdominal fluid on today's date.  EXAM: CT ABDOMEN WITHOUT CONTRAST  TECHNIQUE: Multidetector CT imaging of the abdomen was performed following the standard protocol without IV contrast.  COMPARISON:  Abdominal CT 10/27/2013  FINDINGS: Lower chest:  Unremarkable appearance of the skin subcutaneous tissue the  chest. Surgical changes of prior abdominal laparotomy.  Percutaneous drain in the intercostal space of the right ninth and tenth ribs with subcutaneous gas associated with recent placement.  Heart size within normal limits. Minimal pericardial fluid/thickening.  Compared to the prior CT there has been evacuation of the right-sided pleural fluid. Associated atelectatic changes are present at the right base. Minimal atelectasis at the will left base.  Abdomen:  Fluid and gas collection within the left upper abdomen measures approximately 5.6 cm x 4.7 cm on today's date. Surgical changes are present along the margin of the fluid collection compatible the re- since prior liver surgery. The percutaneous drain traverses the fluid collection and terminates in the hilum of the liver.  Surgical drain from the anterior abdomen terminates at the dome of the liver on the lateral aspect of fluid collection.  The separate fluid collection within the right lobe of the liver measures 19 mm x 41 mm on current study, similar to comparison CT.  Biliary stent appears to have migrated into the duodenum as compared to the prior CT.  No abnormally distended small bowel or colon. Retained oral contrast within the colon.  No evidence of free intraperitoneal air.  No displaced fracture.  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  Surgical changes of prior right liver lobe hemangioma resection, with surgical drain from the anterior abdomen and soft tissue changes of the  right upper quadrant.  Interval resolution of right-sided pleural fluid status post right thoracentesis on today's date.  Biliary stent has migrated into the duodenum.  Signed,  Dulcy Fanny. Earleen Newport, DO  [facility]  [facility]   Electronically Signed   By: Corrie Mckusick O.D.   On: 10/28/2013 17:37   Ct Abdomen  Pelvis W Contrast  10/27/2013   CLINICAL DATA:  Status post partial right hepatectomy with enucleation of hemangioma and cholecystectomy 56/21/3086 complicated by postoperative bile leak and endoscopic stenting.  EXAM: CT ABDOMEN AND PELVIS WITH CONTRAST  TECHNIQUE: Multidetector CT imaging of the abdomen and pelvis was performed using the standard protocol following bolus administration of intravenous contrast.  CONTRAST:  183mL OMNIPAQUE IOHEXOL 300 MG/ML  SOLN  COMPARISON:  ERCP 10/18/2013 and MRCP 08/03/2013. Images from outside CT 06/05/2011 -no report.  FINDINGS: Lung bases: There is a moderate-sized dependent right pleural effusion which may be partially loculated. There is subtotal collapse of the right lower lobe. There is trace pleural fluid and basilar atelectasis on the left. No significant pericardial effusion.  Liver/Biliary/Pancreas: Patient has undergone partial right hepatectomy and cholecystectomy. Within the cholecystectomy bed, there is a tubular shaped fluid collection measuring 4.6 x 1.9 cm transverse. There is a surgical drain posterior to the liver which extends superiorly over the dome. This traverses a collection of fluid and air posterior to the liver which measures 5.4 x 7.1 cm transverse and approximately 12.7 cm cephalocaudad. There is a smaller fluid collection in the subhepatic space which measures 3.5 cm on image 36. No other fluid collections are demonstrated. There is no residual hemangioma within the right lobe. Plastic biliary stent is well positioned. The biliary system is decompressed, and pneumobilia is present. The pancreas appears unremarkable.  Spleen/Adrenal glands: Unremarkable.  Kidneys/Ureters/Bladder: Both kidneys appear normal. There is no hydronephrosis or evidence of urinary tract calculus. The bladder appears normal.  Bowel/Peritoneum: The stomach, small bowel, appendix and colon demonstrate no significant findings. There is stool throughout the colon. A small  amount of free pelvic fluid is present.  Retroperitoneum/Pelvis: Small retroperitoneal lymph nodes are likely reactive. There is mild aortoiliac atherosclerosis. The uterus and ovaries appear normal.  Abdominal wall: Postsurgical changes are present within the anterior abdominal wall. There is no focal fluid collection or hernia.  Musculoskeletal: No acute or significant osseus findings.  IMPRESSION: 1. Large air-fluid collection posterior to the liver, likely a biloma. The surgical drain his along the posterior aspect of this collection. 2. There is a smaller fluid collection within the cholecystectomy bed which is nonspecific. There is also a small amount fluid within the subhepatic space. 3. Decompressed biliary system and pneumobilia status post biliary stent placement. 4. Right pleural effusion with associated subtotal right lower lobe pulmonary collapse.   Electronically Signed   By: Camie Patience M.D.   On: 10/27/2013 15:44   US Aspiration  10/28/2013   INDICATION: 48 year old female status post right liver lobe hemangioma resection. She has developed fevers and pain at home, with concern for right upper quadrant biliary leak. She has been referred for drainage.  EXAM: US ASPIRATION  COMPARISON:  CT 10/27/2013.  Nuclear medicine study 10/17/2013  MEDICATIONS: The patient is currently admitted to the hospital and receiving intravenous antibiotics. The antibiotics were administered within an appropriate time frame prior to the initiation of the procedure.  ANESTHESIA/SEDATION: Fentanyl 100 mcg IV; Versed 2 mg IV  Total Moderate Sedation time  Thirty minutes  CONTRAST:  None  COMPLICATIONS: None immediate  PROCEDURE: Informed written consent was obtained from the patient and the patient's family after a discussion of the risks, benefits and alternatives to treatment. The patient was placed in the left decubitus position on the ultrasound table and a ultrasound survey was completed of the right upper quadrant  with images stored and sent to PACs. The procedure was planned. A timeout was performed prior to the initiation of the procedure.  The right flank was prepped and draped in the usual sterile fashion. The overlying soft tissues were anesthetized with 1% lidocaine with epinephrine. An 18 gauge trocar needle was advanced into the right upper quadrant fluid collection adjacent to the liver under ultrasound guidance, and position was confirmed with aspiration of bilious fluid. A short Amplatz super stiff wire was then advanced into the collection.  The tract was serially dilated allowing placement of a 10 [place] all-purpose drainage catheter.  5 cc of ileus fluid was then aspirated. The tube was connected to a bulb suction and sutured in place. A dressing was placed. The patient tolerated the procedure well without immediate post procedural complication.  IMPRESSION: Status post ultrasound-guided drainage of right upper quadrant fluid collection with 10 French pigtail drain. Sample of fluid was sent to the lab for complete analysis.  Signed,  Dulcy Fanny. Earleen Newport, DO  [facility]  [facility]   Electronically Signed   By: Corrie Mckusick O.D.   On: 10/28/2013 17:50   Ct Image Guided Fluid Drain By Catheter  10/28/2013   CLINICAL DATA:  48 year old female with a history of right liver lobe hemangioma resection. She has developed fevers and suspicion bile leak.  EXAM: CT GUIDED drainage of abdominal fluid collection  ANESTHESIA/SEDATION: One  Mg IV Versed; 25 mcg IV Fentanyl  Total Moderate Sedation Time: 12 minutes.  PROCEDURE: The procedure risks, benefits, and alternatives were explained to the patient. Questions regarding the procedure were encouraged and answered. The patient understands and consents to the procedure.  The patient was positioned and left decubitus on the CT gantry table. Scout CT of the abdomen was performed.  The right flank in the indwelling drain was  prepped with Betadinein a sterile fashion, and a sterile drape was applied covering the operative field. A sterile gown and sterile gloves were used for the procedure. Local anesthesia was provided with 1% Lidocaine.  The indwelling drain was cut and a Amplatz wire was advanced to the drain and position. A CT image confirmed position of the wire and drain within the right upper quadrant fluid collection. The drain was then exchanged for a new 10 French 25 cm pigtail catheter.  Again, CT image confirmed a new drain in position within this fluid collection. Approximately 30 cc of yellowish fluid was aspirated and sent for analysis.  The drain was then sutured in position with a single Prolene retention suture as well as a StatLock.  Complications: None  FINDINGS: Indwelling drain, previously placed with ultrasound, with radiopaque marker outside of the abdominal fascia.  Images during the case demonstrate modified Seldinger technique for exchange of the drain.  Final image demonstrates a new 10.2 French pigtail catheter and right upper quadrant fluid collection.  30 cc of thick yellowish fluid was aspirated.  IMPRESSION: Status post CT-guided drain exchange of right upper quadrant fluid collection. Sample of fluid sent to lab for analysis.  Signed,  Dulcy Fanny. Earleen Newport, DO  [facility]  [facility]   Electronically Signed  By: Corrie Mckusick O.D.   On: 10/28/2013 18:29   US Thoracentesis Asp Pleural Space W/img Guide  10/28/2013   CLINICAL DATA:  48 year old female status post liver tumor resection. She has developed fever and abdominal pain at home. Recent CT demonstrates right-sided pleural effusion as well as right upper quadrant fluid collection.  EXAM: ULTRASOUND GUIDED right THORACENTESIS  COMPARISON:  CT of the abdomen 10/27/2013.  PROCEDURE: An ultrasound guided thoracentesis was thoroughly discussed with the patient and questions answered. The benefits, risks,  alternatives and complications were also discussed. The patient understands and wishes to proceed with the procedure. Written consent was obtained.  Ultrasound was performed to localize and mark an adequate pocket of fluid in the right chest. The area was then prepped and draped in the normal sterile fashion. 1% Lidocaine was used for local anesthesia. Under ultrasound guidance a 19 gauge Yueh catheter was introduced. Thoracentesis was performed. The catheter was removed and a dressing applied.  Complications:  None  FINDINGS: A total of approximately 450 cc of yellowish fluid was removed. A fluid sample wassent for laboratory analysis.  IMPRESSION: Status post right-sided thoracentesis with ultrasound guidance yielding 450 cc of yellowish fluid. A sample was sent the lab for analysis.  Signed,  Dulcy Fanny. Earleen Newport, DO  [facility]  [facility]   Electronically Signed   By: Corrie Mckusick O.D.   On: 10/28/2013 17:44    Anti-infectives: Anti-infectives   Start     Dose/Rate Route Frequency Ordered Stop   10/28/13 2100  piperacillin-tazobactam (ZOSYN) IVPB 3.375 g     3.375 g 12.5 mL/hr over 240 Minutes Intravenous Every 8 hours 10/28/13 2047        Assessment/Plan: s/p * No surgery found * Plan for discharge tomorrow Continue IV antibiotics. Diet as tolerated.     LOS: 1 day    [facility] 10/29/2013

## 2013-10-30 NOTE — Progress Notes (Signed)
Patient ID: Deborah Trujillo, female   DOB: 1965-02-23, 48 y.o.   MRN: [id number]   Referring Physician(s): Dr Barry Dienes  Subjective:  Liver hemangioma partial hepatectomy and cholecystotomy 09/30/13 Developed fluid collection and abd pain Surgical drain RLQ was upsized and repositioned 9/17 New Rt flank drain placed same day Does feel some better BM yesterday    Allergies: Review of patient's allergies indicates no known allergies.  Medications: Prior to Admission medications   Medication Sig Start Date End Date Taking? Authorizing Provider  amoxicillin-clavulanate (AUGMENTIN) 875-125 MG per tablet Take 1 tablet by mouth every 12 (twelve) hours. 10/21/13  Yes Stark Klein, MD  colestipol (COLESTID) 1 G tablet Take 2 tablets (2 g total) by mouth 3 (three) times daily. 10/12/13  Yes Stark Klein, MD  omeprazole (PRILOSEC) 20 MG capsule Take 1 capsule (20 mg total) by mouth daily. 10/21/13  Yes Stark Klein, MD  oxyCODONE (OXY IR/ROXICODONE) 5 MG immediate release tablet Take 1-2 tablets (5-10 mg total) by mouth every 4 (four) hours as needed for moderate pain, severe pain or breakthrough pain. 10/21/13  Yes Stark Klein, MD    Review of Systems  Vital Signs: BP 91/52  Pulse 90  Temp(Src) 98.4 F (36.9 C) (Oral)  Resp 16  Ht 4' 11"  (1.499 m)  Wt 69.219 kg (152 lb 9.6 oz)  BMI 30.81 kg/m2  SpO2 93%  Physical Exam  Abdominal:  Drains in place RLQ: output greenish fluid 35 cc yesterday; 15 cc in JP now Site clean and dry; tender  Rt flank drain intact Site clean and dry Tender Output milky white: 125 cc yesterday; 20 cc in JP now  Afeb; vss    Imaging: Ct Abdomen Wo Contrast  10/28/2013   CLINICAL DATA:  48 year old female status post liver hemangioma resection. She has had increasing abdominal pain and fevers at home. Recent CT scan demonstrates intra abdominal fluid collection which is suspicious for a bile leak on a nuclear medicine study.  The patient has undergone  right thoracentesis as well as ultrasound-guided drainage of the abdominal fluid on today's date.  EXAM: CT ABDOMEN WITHOUT CONTRAST  TECHNIQUE: Multidetector CT imaging of the abdomen was performed following the standard protocol without IV contrast.  COMPARISON:  Abdominal CT 10/27/2013  FINDINGS: Lower chest:  Unremarkable appearance of the skin subcutaneous tissue the chest. Surgical changes of prior abdominal laparotomy.  Percutaneous drain in the intercostal space of the right ninth and tenth ribs with subcutaneous gas associated with recent placement.  Heart size within normal limits. Minimal pericardial fluid/thickening.  Compared to the prior CT there has been evacuation of the right-sided pleural fluid. Associated atelectatic changes are present at the right base. Minimal atelectasis at the will left base.  Abdomen:  Fluid and gas collection within the left upper abdomen measures approximately 5.6 cm x 4.7 cm on today's date. Surgical changes are present along the margin of the fluid collection compatible the re- since prior liver surgery. The percutaneous drain traverses the fluid collection and terminates in the hilum of the liver.  Surgical drain from the anterior abdomen terminates at the dome of the liver on the lateral aspect of fluid collection.  The separate fluid collection within the right lobe of the liver measures 19 mm x 41 mm on current study, similar to comparison CT.  Biliary stent appears to have migrated into the duodenum as compared to the prior CT.  No abnormally distended small bowel or colon. Retained oral contrast within the colon.  No evidence of free intraperitoneal air.  No displaced fracture.  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  Surgical changes of prior right liver lobe hemangioma resection, with surgical drain from the anterior abdomen and soft tissue  changes of the right upper quadrant.  Interval resolution of right-sided pleural fluid status post right thoracentesis on today's date.  Biliary stent has migrated into the duodenum.  Signed,  Dulcy Fanny. Earleen Newport, DO  [facility]  [facility]   Electronically Signed   By: Corrie Mckusick O.D.   On: 10/28/2013 17:37   Ct Abdomen Pelvis W Contrast  10/27/2013   CLINICAL DATA:  Status post partial right hepatectomy with enucleation of hemangioma and cholecystectomy 58/52/7782 complicated by postoperative bile leak and endoscopic stenting.  EXAM: CT ABDOMEN AND PELVIS WITH CONTRAST  TECHNIQUE: Multidetector CT imaging of the abdomen and pelvis was performed using the standard protocol following bolus administration of intravenous contrast.  CONTRAST:  116m OMNIPAQUE IOHEXOL 300 MG/ML  SOLN  COMPARISON:  ERCP 10/18/2013 and MRCP 08/03/2013. Images from outside CT 06/05/2011 -no report.  FINDINGS: Lung bases: There is a moderate-sized dependent right pleural effusion which may be partially loculated. There is subtotal collapse of the right lower lobe. There is trace pleural fluid and basilar atelectasis on the left. No significant pericardial effusion.  Liver/Biliary/Pancreas: Patient has undergone partial right hepatectomy and cholecystectomy. Within the cholecystectomy bed, there is a tubular shaped fluid collection measuring 4.6 x 1.9 cm transverse. There is a surgical drain posterior to the liver which extends superiorly over the dome. This traverses a collection of fluid and air posterior to the liver which measures 5.4 x 7.1 cm transverse and approximately 12.7 cm cephalocaudad. There is a smaller fluid collection in the subhepatic space which measures 3.5 cm on image 36. No other fluid collections are demonstrated. There is no residual hemangioma within the right lobe. Plastic biliary stent is well positioned. The biliary system is decompressed, and pneumobilia is  present. The pancreas appears unremarkable.  Spleen/Adrenal glands: Unremarkable.  Kidneys/Ureters/Bladder: Both kidneys appear normal. There is no hydronephrosis or evidence of urinary tract calculus. The bladder appears normal.  Bowel/Peritoneum: The stomach, small bowel, appendix and colon demonstrate no significant findings. There is stool throughout the colon. A small amount of free pelvic fluid is present.  Retroperitoneum/Pelvis: Small retroperitoneal lymph nodes are likely reactive. There is mild aortoiliac atherosclerosis. The uterus and ovaries appear normal.  Abdominal wall: Postsurgical changes are present within the anterior abdominal wall. There is no focal fluid collection or hernia.  Musculoskeletal: No acute or significant osseus findings.  IMPRESSION: 1. Large air-fluid collection posterior to the liver, likely a biloma. The surgical drain his along the posterior aspect of this collection. 2. There is a smaller fluid collection within the cholecystectomy bed which is nonspecific. There is also a small amount fluid within the subhepatic space. 3. Decompressed biliary system and pneumobilia status post biliary stent placement. 4. Right pleural effusion with associated subtotal right lower lobe pulmonary collapse.   Electronically Signed   By: BCamie PatienceM.D.   On: 10/27/2013 15:44   [place]  10/28/2013   INDICATION: 48year old female status post right liver lobe hemangioma resection. She has developed fevers and pain at home, with concern for right upper quadrant biliary leak. She has been referred for drainage.  EXAM: [place]  COMPARISON:  CT 10/27/2013.  Nuclear  medicine study 10/17/2013  MEDICATIONS: The patient is currently admitted to the hospital and receiving intravenous antibiotics. The antibiotics were administered within an appropriate time frame prior to the initiation of the procedure.  ANESTHESIA/SEDATION: Fentanyl 100 mcg IV; Versed 2 mg IV  Total Moderate Sedation  time  Thirty minutes  CONTRAST:  None  COMPLICATIONS: None immediate  PROCEDURE: Informed written consent was obtained from the patient and the patient's family after a discussion of the risks, benefits and alternatives to treatment. The patient was placed in the left decubitus position on the ultrasound table and a ultrasound survey was completed of the right upper quadrant with images stored and sent to PACs. The procedure was planned. A timeout was performed prior to the initiation of the procedure.  The right flank was prepped and draped in the usual sterile fashion. The overlying soft tissues were anesthetized with 1% lidocaine with epinephrine. An 18 gauge trocar needle was advanced into the right upper quadrant fluid collection adjacent to the liver under ultrasound guidance, and position was confirmed with aspiration of bilious fluid. A short Amplatz super stiff wire was then advanced into the collection.  The tract was serially dilated allowing placement of a 10 [place] all-purpose drainage catheter.  5 cc of ileus fluid was then aspirated. The tube was connected to a bulb suction and sutured in place. A dressing was placed. The patient tolerated the procedure well without immediate post procedural complication.  IMPRESSION: Status post ultrasound-guided drainage of right upper quadrant fluid collection with 10 French pigtail drain. Sample of fluid was sent to the lab for complete analysis.  Signed,  Dulcy Fanny. Earleen Newport, DO  [facility]  [facility]   Electronically Signed   By: Corrie Mckusick O.D.   On: 10/28/2013 17:50   Ct Image Guided Fluid Drain By Catheter  10/28/2013   CLINICAL DATA:  48 year old female with a history of right liver lobe hemangioma resection. She has developed fevers and suspicion bile leak.  EXAM: CT GUIDED drainage of abdominal fluid collection  ANESTHESIA/SEDATION: One  Mg IV Versed; 25 mcg IV Fentanyl  Total Moderate Sedation Time: 12  minutes.  PROCEDURE: The procedure risks, benefits, and alternatives were explained to the patient. Questions regarding the procedure were encouraged and answered. The patient understands and consents to the procedure.  The patient was positioned and left decubitus on the CT gantry table. Scout CT of the abdomen was performed.  The right flank in the indwelling drain was prepped with Betadinein a sterile fashion, and a sterile drape was applied covering the operative field. A sterile gown and sterile gloves were used for the procedure. Local anesthesia was provided with 1% Lidocaine.  The indwelling drain was cut and a Amplatz wire was advanced to the drain and position. A CT image confirmed position of the wire and drain within the right upper quadrant fluid collection. The drain was then exchanged for a new 10 French 25 cm pigtail catheter.  Again, CT image confirmed a new drain in position within this fluid collection. Approximately 30 cc of yellowish fluid was aspirated and sent for analysis.  The drain was then sutured in position with a single Prolene retention suture as well as a StatLock.  Complications: None  FINDINGS: Indwelling drain, previously placed with ultrasound, with radiopaque marker outside of the abdominal fascia.  Images during the case demonstrate modified Seldinger technique for exchange of the drain.  Final image demonstrates a new 10.2 French pigtail catheter and  right upper quadrant fluid collection.  30 cc of thick yellowish fluid was aspirated.  IMPRESSION: Status post CT-guided drain exchange of right upper quadrant fluid collection. Sample of fluid sent to lab for analysis.  Signed,  Dulcy Fanny. Earleen Newport, DO  [facility]  [facility]   Electronically Signed   By: Corrie Mckusick O.D.   On: 10/28/2013 18:29   US Thoracentesis Asp Pleural Space W/img Guide  10/28/2013   CLINICAL DATA:  48 year old female status post liver tumor resection. She  has developed fever and abdominal pain at home. Recent CT demonstrates right-sided pleural effusion as well as right upper quadrant fluid collection.  EXAM: ULTRASOUND GUIDED right THORACENTESIS  COMPARISON:  CT of the abdomen 10/27/2013.  PROCEDURE: An ultrasound guided thoracentesis was thoroughly discussed with the patient and questions answered. The benefits, risks, alternatives and complications were also discussed. The patient understands and wishes to proceed with the procedure. Written consent was obtained.  Ultrasound was performed to localize and mark an adequate pocket of fluid in the right chest. The area was then prepped and draped in the normal sterile fashion. 1% Lidocaine was used for local anesthesia. Under ultrasound guidance a 19 gauge Yueh catheter was introduced. Thoracentesis was performed. The catheter was removed and a dressing applied.  Complications:  None  FINDINGS: A total of approximately 450 cc of yellowish fluid was removed. A fluid sample wassent for laboratory analysis.  IMPRESSION: Status post right-sided thoracentesis with ultrasound guidance yielding 450 cc of yellowish fluid. A sample was sent the lab for analysis.  Signed,  Dulcy Fanny. Earleen Newport, DO  [facility]  [facility]   Electronically Signed   By: Corrie Mckusick O.D.   On: 10/28/2013 17:44    Labs: Results for orders placed during the hospital encounter of 10/28/13 (from the past 48 hour(s))  COMPREHENSIVE METABOLIC PANEL     Status: Abnormal   Collection Time    10/28/13  9:43 PM      Result Value Ref Range   Sodium 135 (*) 137 - 147 mEq/L   Potassium 4.2  3.7 - 5.3 mEq/L   Chloride 98  96 - 112 mEq/L   CO2 21  19 - 32 mEq/L   Glucose, Bld 112 (*) 70 - 99 mg/dL   BUN 8  6 - 23 mg/dL   Creatinine, Ser 0.55  0.50 - 1.10 mg/dL   Calcium 8.3 (*) 8.4 - 10.5 mg/dL   Total Protein 6.1  6.0 - 8.3 g/dL   Albumin 2.1 (*) 3.5 - 5.2 g/dL   AST 15  0 - 37 U/L   ALT 12  0 -  35 U/L   Alkaline Phosphatase 90  39 - 117 U/L   Total Bilirubin 0.4  0.3 - 1.2 mg/dL   GFR calc non Af Amer >90  >90 mL/min   GFR calc Af Amer >90  >90 mL/min   Comment: (NOTE)     The eGFR has been calculated using the CKD EPI equation.     This calculation has not been validated in all clinical situations.     eGFR's persistently <90 mL/min signify possible Chronic Kidney     Disease.   Anion gap 16 (*) 5 - 15  PROTIME-INR     Status: Abnormal   Collection Time    10/28/13  9:43 PM      Result Value Ref Range   Prothrombin Time 16.5 (*) 11.6 - 15.2 seconds  INR 1.33  0.00 - 1.49  MAGNESIUM     Status: None   Collection Time    10/28/13  9:43 PM      Result Value Ref Range   Magnesium 1.6  1.5 - 2.5 mg/dL  PHOSPHORUS     Status: None   Collection Time    10/28/13  9:43 PM      Result Value Ref Range   Phosphorus 3.3  2.3 - 4.6 mg/dL  CBC WITH DIFFERENTIAL     Status: Abnormal   Collection Time    10/28/13  9:43 PM      Result Value Ref Range   WBC 10.9 (*) 4.0 - 10.5 K/uL   RBC 4.03  3.87 - 5.11 MIL/uL   Hemoglobin 11.4 (*) 12.0 - 15.0 g/dL   HCT 34.8 (*) 36.0 - 46.0 %   MCV 86.4  78.0 - 100.0 fL   MCH 28.3  26.0 - 34.0 pg   MCHC 32.8  30.0 - 36.0 g/dL   RDW 14.6  11.5 - 15.5 %   Platelets 290  150 - 400 K/uL   Neutrophils Relative % 84 (*) 43 - 77 %   Neutro Abs 9.1 (*) 1.7 - 7.7 K/uL   Lymphocytes Relative 9 (*) 12 - 46 %   Lymphs Abs 1.0  0.7 - 4.0 K/uL   Monocytes Relative 7  3 - 12 %   Monocytes Absolute 0.8  0.1 - 1.0 K/uL   Eosinophils Relative 0  0 - 5 %   Eosinophils Absolute 0.0  0.0 - 0.7 K/uL   Basophils Relative 0  0 - 1 %   Basophils Absolute 0.0  0.0 - 0.1 K/uL    Assessment and Plan: RLQ surgical drain and new r flank drain intact Draining well Pt does feel better Still abd pain followed by [facility]   Lavonia Drafts PAC-IR   I spent a total of 15 minutes face to face in clinical consultation/evaluation, greater than 50% of which was  counseling/coordinating care for abd abscess/biloma drains

## 2013-10-30 NOTE — Progress Notes (Signed)
Subjective: Alert and stable. Lying in bed on left side. Denies nausea or vomiting.  Ambulating to the bathroom. Pain reasonably well-controlled. No fevers. Both right back drain and right upper quadrant drain her are draining yellowish watery fluid. Gram stain growing gram-negative rods. On Zosyn.  Objective: Vital signs in last 24 hours: Temp:  [98.1 F (36.7 C)-99.2 F (37.3 C)] 98.4 F (36.9 C) (09/19 0525) Pulse Rate:  [88-114] 90 (09/19 0525) Resp:  [16-18] 16 (09/19 0525) BP: (80-113)/(42-79) 91/52 mmHg (09/19 0525) SpO2:  [91 %-95 %] 93 % (09/19 0525) Last BM Date: 10/29/13  Intake/Output from previous day: 09/18 0701 - 09/19 0700 In: 245 [P.O.:240] Out: 510 [Urine:350; Drains:160] Intake/Output this shift:    General appearance: alert. Mild distress. Communicates well although there is a language  barrier. GI: abdomen is soft.  Not tender. Not distended. Both drains functioning with yellowish watery fluid.  Lab Results:   Recent Labs  10/28/13 1259 10/28/13 2143  WBC 13.6* 10.9*  HGB 12.8 11.4*  HCT 38.8 34.8*  PLT 333 290   BMET  Recent Labs  10/28/13 2143  NA 135*  K 4.2  CL 98  CO2 21  GLUCOSE 112*  BUN 8  CREATININE 0.55  CALCIUM 8.3*   PT/INR  Recent Labs  10/28/13 1259 10/28/13 2143  LABPROT 16.2* 16.5*  INR 1.30 1.33   ABG No results found for this basename: PHART, PCO2, PO2, HCO3,  in the last 72 hours  Studies/Results: Ct Abdomen Wo Contrast  10/28/2013   CLINICAL DATA:  48 year old female status post liver hemangioma resection. She has had increasing abdominal pain and fevers at home. Recent CT scan demonstrates intra abdominal fluid collection which is suspicious for a bile leak on a nuclear medicine study.  The patient has undergone right thoracentesis as well as ultrasound-guided drainage of the abdominal fluid on today's date.  EXAM: CT ABDOMEN WITHOUT CONTRAST  TECHNIQUE: Multidetector CT imaging of the abdomen was  performed following the standard protocol without IV contrast.  COMPARISON:  Abdominal CT 10/27/2013  FINDINGS: Lower chest:  Unremarkable appearance of the skin subcutaneous tissue the chest. Surgical changes of prior abdominal laparotomy.  Percutaneous drain in the intercostal space of the right ninth and tenth ribs with subcutaneous gas associated with recent placement.  Heart size within normal limits. Minimal pericardial fluid/thickening.  Compared to the prior CT there has been evacuation of the right-sided pleural fluid. Associated atelectatic changes are present at the right base. Minimal atelectasis at the will left base.  Abdomen:  Fluid and gas collection within the left upper abdomen measures approximately 5.6 cm x 4.7 cm on today's date. Surgical changes are present along the margin of the fluid collection compatible the re- since prior liver surgery. The percutaneous drain traverses the fluid collection and terminates in the hilum of the liver.  Surgical drain from the anterior abdomen terminates at the dome of the liver on the lateral aspect of fluid collection.  The separate fluid collection within the right lobe of the liver measures 19 mm x 41 mm on current study, similar to comparison CT.  Biliary stent appears to have migrated into the duodenum as compared to the prior CT.  No abnormally distended small bowel or colon. Retained oral contrast within the colon.  No evidence of free intraperitoneal air.  No displaced fracture.  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  Surgical changes  of prior right liver lobe hemangioma resection, with surgical drain from the anterior abdomen and soft tissue changes of the right upper quadrant.  Interval resolution of right-sided pleural fluid status post right thoracentesis on today's date.  Biliary stent has migrated into the duodenum.   Signed,  Dulcy Fanny. Earleen Newport, DO  [facility]  [facility]   Electronically Signed   By: Corrie Mckusick O.D.   On: 10/28/2013 17:37   US Aspiration  10/28/2013   INDICATION: 48 year old female status post right liver lobe hemangioma resection. She has developed fevers and pain at home, with concern for right upper quadrant biliary leak. She has been referred for drainage.  EXAM: US ASPIRATION  COMPARISON:  CT 10/27/2013.  Nuclear medicine study 10/17/2013  MEDICATIONS: The patient is currently admitted to the hospital and receiving intravenous antibiotics. The antibiotics were administered within an appropriate time frame prior to the initiation of the procedure.  ANESTHESIA/SEDATION: Fentanyl 100 mcg IV; Versed 2 mg IV  Total Moderate Sedation time  Thirty minutes  CONTRAST:  None  COMPLICATIONS: None immediate  PROCEDURE: Informed written consent was obtained from the patient and the patient's family after a discussion of the risks, benefits and alternatives to treatment. The patient was placed in the left decubitus position on the ultrasound table and a ultrasound survey was completed of the right upper quadrant with images stored and sent to PACs. The procedure was planned. A timeout was performed prior to the initiation of the procedure.  The right flank was prepped and draped in the usual sterile fashion. The overlying soft tissues were anesthetized with 1% lidocaine with epinephrine. An 18 gauge trocar needle was advanced into the right upper quadrant fluid collection adjacent to the liver under ultrasound guidance, and position was confirmed with aspiration of bilious fluid. A short Amplatz super stiff wire was then advanced into the collection.  The tract was serially dilated allowing placement of a 10 [place] all-purpose drainage catheter.  5 cc of ileus fluid was then aspirated. The tube was connected to a bulb suction and sutured in place. A dressing was  placed. The patient tolerated the procedure well without immediate post procedural complication.  IMPRESSION: Status post ultrasound-guided drainage of right upper quadrant fluid collection with 10 French pigtail drain. Sample of fluid was sent to the lab for complete analysis.  Signed,  Dulcy Fanny. Earleen Newport, DO  [facility]  [facility]   Electronically Signed   By: Corrie Mckusick O.D.   On: 10/28/2013 17:50   Ct Image Guided Fluid Drain By Catheter  10/28/2013   CLINICAL DATA:  48 year old female with a history of right liver lobe hemangioma resection. She has developed fevers and suspicion bile leak.  EXAM: CT GUIDED drainage of abdominal fluid collection  ANESTHESIA/SEDATION: One  Mg IV Versed; 25 mcg IV Fentanyl  Total Moderate Sedation Time: 12 minutes.  PROCEDURE: The procedure risks, benefits, and alternatives were explained to the patient. Questions regarding the procedure were encouraged and answered. The patient understands and consents to the procedure.  The patient was positioned and left decubitus on the CT gantry table. Scout CT of the abdomen was performed.  The right flank in the indwelling drain was prepped with Betadinein a sterile fashion, and a sterile drape was applied covering the operative field. A sterile gown and sterile gloves were used for the procedure. Local anesthesia was provided with 1% Lidocaine.  The indwelling drain was cut and a Amplatz  wire was advanced to the drain and position. A CT image confirmed position of the wire and drain within the right upper quadrant fluid collection. The drain was then exchanged for a new 10 French 25 cm pigtail catheter.  Again, CT image confirmed a new drain in position within this fluid collection. Approximately 30 cc of yellowish fluid was aspirated and sent for analysis.  The drain was then sutured in position with a single Prolene retention suture as well as a StatLock.  Complications: None   FINDINGS: Indwelling drain, previously placed with ultrasound, with radiopaque marker outside of the abdominal fascia.  Images during the case demonstrate modified Seldinger technique for exchange of the drain.  Final image demonstrates a new 10.2 French pigtail catheter and right upper quadrant fluid collection.  30 cc of thick yellowish fluid was aspirated.  IMPRESSION: Status post CT-guided drain exchange of right upper quadrant fluid collection. Sample of fluid sent to lab for analysis.  Signed,  Dulcy Fanny. Earleen Newport, DO  [facility]  [facility]   Electronically Signed   By: Corrie Mckusick O.D.   On: 10/28/2013 18:29   US Thoracentesis Asp Pleural Space W/img Guide  10/28/2013   CLINICAL DATA:  48 year old female status post liver tumor resection. She has developed fever and abdominal pain at home. Recent CT demonstrates right-sided pleural effusion as well as right upper quadrant fluid collection.  EXAM: ULTRASOUND GUIDED right THORACENTESIS  COMPARISON:  CT of the abdomen 10/27/2013.  PROCEDURE: An ultrasound guided thoracentesis was thoroughly discussed with the patient and questions answered. The benefits, risks, alternatives and complications were also discussed. The patient understands and wishes to proceed with the procedure. Written consent was obtained.  Ultrasound was performed to localize and mark an adequate pocket of fluid in the right chest. The area was then prepped and draped in the normal sterile fashion. 1% Lidocaine was used for local anesthesia. Under ultrasound guidance a 19 gauge Yueh catheter was introduced. Thoracentesis was performed. The catheter was removed and a dressing applied.  Complications:  None  FINDINGS: A total of approximately 450 cc of yellowish fluid was removed. A fluid sample wassent for laboratory analysis.  IMPRESSION: Status post right-sided thoracentesis with ultrasound guidance yielding 450 cc of yellowish fluid. A sample  was sent the lab for analysis.  Signed,  Dulcy Fanny. Earleen Newport, DO  [facility]  [facility]   Electronically Signed   By: Corrie Mckusick O.D.   On: 10/28/2013 17:44    Anti-infectives: Anti-infectives   Start     Dose/Rate Route Frequency Ordered Stop   10/28/13 2100  piperacillin-tazobactam (ZOSYN) IVPB 3.375 g     3.375 g 12.5 mL/hr over 240 Minutes Intravenous Every 8 hours 10/28/13 2047        Assessment/Plan:  Biloma, status post liver resection, recurrent. Drain replaced and upsized yesterday. Functioning normally Continue IV Zosyn Check cultures  Right pleural effusion. Aspirated yesterday.  Hopefully home in one to 2 days, depending on progress. Ambulation encouraged   LOS: 2 days    Deveon Kisiel M 10/30/2013

## 2013-10-31 ENCOUNTER — Inpatient Hospital Stay (HOSPITAL_COMMUNITY): Payer: Self-pay

## 2013-10-31 LAB — COMPREHENSIVE METABOLIC PANEL
ALBUMIN: 1.9 g/dL — AB (ref 3.5–5.2)
ALK PHOS: 110 U/L (ref 39–117)
ALT: 11 U/L (ref 0–35)
ANION GAP: 13 (ref 5–15)
AST: 15 U/L (ref 0–37)
BILIRUBIN TOTAL: 0.3 mg/dL (ref 0.3–1.2)
BUN: 7 mg/dL (ref 6–23)
CHLORIDE: 102 meq/L (ref 96–112)
CO2: 24 mEq/L (ref 19–32)
Calcium: 8.2 mg/dL — ABNORMAL LOW (ref 8.4–10.5)
Creatinine, Ser: 0.62 mg/dL (ref 0.50–1.10)
GFR calc non Af Amer: >90 mL/min (ref >90)
Glucose, Bld: 105 mg/dL — ABNORMAL HIGH (ref 70–99)
Potassium: 3.5 mEq/L — ABNORMAL LOW (ref 3.7–5.3)
Sodium: 139 mEq/L (ref 137–147)
Total Protein: 5.8 g/dL — ABNORMAL LOW (ref 6.0–8.3)

## 2013-10-31 LAB — BODY FLUID CULTURE: Gram Stain: NONE SEEN

## 2013-10-31 LAB — CULTURE, ROUTINE-ABSCESS: Gram Stain: NONE SEEN

## 2013-10-31 LAB — CBC
HCT: 31.8 % — ABNORMAL LOW (ref 36.0–46.0)
Hemoglobin: 10.2 g/dL — ABNORMAL LOW (ref 12.0–15.0)
MCH: 27.9 pg (ref 26.0–34.0)
MCHC: 32.1 g/dL (ref 30.0–36.0)
MCV: 87.1 fL (ref 78.0–100.0)
Platelets: 313 10*3/uL (ref 150–400)
RBC: 3.65 MIL/uL — ABNORMAL LOW (ref 3.87–5.11)
RDW: 15 % (ref 11.5–15.5)
WBC: 6.2 10*3/uL (ref 4.0–10.5)

## 2013-10-31 NOTE — Progress Notes (Signed)
  Subjective: Alert. Lying in bed on left side. Complains of right back pain and cough. Afebrile. Vital signs stable. WBC down to 6200.  Right pleural fluid Gram stain and culture negative so far. Abdominal fluid culture and Gram stain growing gram-negative rods. On Zosyn.  Objective: Vital signs in last 24 hours: Temp:  [97.8 F (36.6 C)-99 F (37.2 C)] 98.1 F (36.7 C) (09/20 0456) Pulse Rate:  [81-113] 81 (09/20 0456) Resp:  [16-20] 20 (09/20 0456) BP: (72-107)/(35-66) 103/48 mmHg (09/20 0456) SpO2:  [92 %-95 %] 93 % (09/20 0456) Last BM Date: 10/29/13  Intake/Output from previous day: 09/19 0701 - 09/20 0700 In: 1280.7 [P.O.:240; I.V.:730.7; IV Piggyback:300] Out: 135 [Drains:135] Intake/Output this shift:    General appearance: alert and cooperative. Does not look toxic. Does look for T. and does not feel well. Resp: clear to auscultation bilaterally Tender around the right back drain site. Drainage cloudy yellow. GI: abdomen is generally soft and not very tender. Does not appear distended.  Lab Results:   Recent Labs  10/28/13 2143 10/31/13 0545  WBC 10.9* 6.2  HGB 11.4* 10.2*  HCT 34.8* 31.8*  PLT 290 313   BMET  Recent Labs  10/28/13 2143 10/31/13 0545  NA 135* 139  K 4.2 3.5*  CL 98 102  CO2 21 24  GLUCOSE 112* 105*  BUN 8 7  CREATININE 0.55 0.62  CALCIUM 8.3* 8.2*   PT/INR  Recent Labs  10/28/13 1259 10/28/13 2143  LABPROT 16.2* 16.5*  INR 1.30 1.33   ABG No results found for this basename: PHART, PCO2, PO2, HCO3,  in the last 72 hours  Studies/Results: No results found.  Anti-infectives: Anti-infectives   Start     Dose/Rate Route Frequency Ordered Stop   10/28/13 2100  piperacillin-tazobactam (ZOSYN) IVPB 3.375 g     3.375 g 12.5 mL/hr over 240 Minutes Intravenous Every 8 hours 10/28/13 2047        Assessment/Plan:  Biloma, status post liver resection, recurrent.  Drain replaced and upsized yesterday. Functioning  normally  Continue IV Zosyn  Check cultures   Right pleural effusion. Aspirated yesterday. Increased pulmonary symptoms today. We'll get a 3 view chest x-ray today  Clearly not ready for discharge yet  Ambulation encouraged    LOS: 3 days    Kayliee Atienza M 10/31/2013

## 2013-10-31 NOTE — Progress Notes (Signed)
Significant atelectasis in the right base with effusion.  No pneumothorax.  Deborah Trujillo. Dahlia Bailiff, MD, [place] [phone] [facility]

## 2013-10-31 NOTE — Progress Notes (Signed)
Patient ID: Deborah Trujillo, female   DOB: [birth year], 48 y.o.   MRN: [id number]   Referring Physician(s): Dr Stark Klein  Subjective:  Rt surgical abdominal drain repositioned 9/17 New Rt flank drain placed 9/17 Rt pleural effusion thoracentesis 9/17 Some better daily Still very painful abd and R flank  Allergies: Review of patient's allergies indicates no known allergies.  Medications: Prior to Admission medications   Medication Sig Start Date End Date Taking? Authorizing Provider  amoxicillin-clavulanate (AUGMENTIN) 875-125 MG per tablet Take 1 tablet by mouth every 12 (twelve) hours. 10/21/13  Yes Stark Klein, MD  colestipol (COLESTID) 1 G tablet Take 2 tablets (2 g total) by mouth 3 (three) times daily. 10/12/13  Yes Stark Klein, MD  omeprazole (PRILOSEC) 20 MG capsule Take 1 capsule (20 mg total) by mouth daily. 10/21/13  Yes Stark Klein, MD  oxyCODONE (OXY IR/ROXICODONE) 5 MG immediate release tablet Take 1-2 tablets (5-10 mg total) by mouth every 4 (four) hours as needed for moderate pain, severe pain or breakthrough pain. 10/21/13  Yes Stark Klein, MD    Review of Systems  Vital Signs: BP 103/48  Pulse 81  Temp(Src) 98.1 F (36.7 C) (Oral)  Resp 20  Ht _0  (1.499 m)  Wt 69.219 kg (152 lb 9.6 oz)  BMI 30.81 kg/m2  SpO2 93%  Physical Exam  Abdominal:  abd soft; diffuse tender  RLQ abd drain (surgical drain) intact Output thin green fluid: 15 cc yesterday; 10 cc now Site clean and dry; tender  R flank drain intact Output thick dark yellow 120 cc yesterday; 20 cc in now Cxs enterobacter cloacae Site clean and ry; tender     Imaging: Ct Abdomen Wo Contrast  10/28/2013   CLINICAL DATA:  48 year old female status post liver hemangioma resection. She has had increasing abdominal pain and fevers at home. Recent CT scan demonstrates intra abdominal fluid collection which is suspicious for a bile leak on a nuclear medicine study.  The patient has  undergone right thoracentesis as well as ultrasound-guided drainage of the abdominal fluid on today's date.  EXAM: CT ABDOMEN WITHOUT CONTRAST  TECHNIQUE: Multidetector CT imaging of the abdomen was performed following the standard protocol without IV contrast.  COMPARISON:  Abdominal CT 10/27/2013  FINDINGS: Lower chest:  Unremarkable appearance of the skin subcutaneous tissue the chest. Surgical changes of prior abdominal laparotomy.  Percutaneous drain in the intercostal space of the right ninth and tenth ribs with subcutaneous gas associated with recent placement.  Heart size within normal limits. Minimal pericardial fluid/thickening.  Compared to the prior CT there has been evacuation of the right-sided pleural fluid. Associated atelectatic changes are present at the right base. Minimal atelectasis at the will left base.  Abdomen:  Fluid and gas collection within the left upper abdomen measures approximately 5.6 cm x 4.7 cm on today's date. Surgical changes are present along the margin of the fluid collection compatible the re- since prior liver surgery. The percutaneous drain traverses the fluid collection and terminates in the hilum of the liver.  Surgical drain from the anterior abdomen terminates at the dome of the liver on the lateral aspect of fluid collection.  The separate fluid collection within the right lobe of the liver measures 19 mm x 41 mm on current study, similar to comparison CT.  Biliary stent appears to have migrated into the duodenum as compared to the prior CT.  No abnormally distended small bowel or colon. Retained oral contrast within the colon.  No  evidence of free intraperitoneal air.  No displaced fracture.  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  Surgical changes of prior right liver lobe hemangioma resection, with surgical drain from the anterior abdomen and  soft tissue changes of the right upper quadrant.  Interval resolution of right-sided pleural fluid status post right thoracentesis on today's date.  Biliary stent has migrated into the duodenum.  Signed,  Dulcy Fanny. Earleen Newport, DO  [facility]  [facility]   Electronically Signed   By: Corrie Mckusick O.D.   On: 10/28/2013 17:37   Ct Abdomen Pelvis W Contrast  10/27/2013   CLINICAL DATA:  Status post partial right hepatectomy with enucleation of hemangioma and cholecystectomy 29/56/2130 complicated by postoperative bile leak and endoscopic stenting.  EXAM: CT ABDOMEN AND PELVIS WITH CONTRAST  TECHNIQUE: Multidetector CT imaging of the abdomen and pelvis was performed using the standard protocol following bolus administration of intravenous contrast.  CONTRAST:  183m OMNIPAQUE IOHEXOL 300 MG/ML  SOLN  COMPARISON:  ERCP 10/18/2013 and MRCP 08/03/2013. Images from outside CT 06/05/2011 -no report.  FINDINGS: Lung bases: There is a moderate-sized dependent right pleural effusion which may be partially loculated. There is subtotal collapse of the right lower lobe. There is trace pleural fluid and basilar atelectasis on the left. No significant pericardial effusion.  Liver/Biliary/Pancreas: Patient has undergone partial right hepatectomy and cholecystectomy. Within the cholecystectomy bed, there is a tubular shaped fluid collection measuring 4.6 x 1.9 cm transverse. There is a surgical drain posterior to the liver which extends superiorly over the dome. This traverses a collection of fluid and air posterior to the liver which measures 5.4 x 7.1 cm transverse and approximately 12.7 cm cephalocaudad. There is a smaller fluid collection in the subhepatic space which measures 3.5 cm on image 36. No other fluid collections are demonstrated. There is no residual hemangioma within the right lobe. Plastic biliary stent is well positioned. The biliary system is decompressed, and  pneumobilia is present. The pancreas appears unremarkable.  Spleen/Adrenal glands: Unremarkable.  Kidneys/Ureters/Bladder: Both kidneys appear normal. There is no hydronephrosis or evidence of urinary tract calculus. The bladder appears normal.  Bowel/Peritoneum: The stomach, small bowel, appendix and colon demonstrate no significant findings. There is stool throughout the colon. A small amount of free pelvic fluid is present.  Retroperitoneum/Pelvis: Small retroperitoneal lymph nodes are likely reactive. There is mild aortoiliac atherosclerosis. The uterus and ovaries appear normal.  Abdominal wall: Postsurgical changes are present within the anterior abdominal wall. There is no focal fluid collection or hernia.  Musculoskeletal: No acute or significant osseus findings.  IMPRESSION: 1. Large air-fluid collection posterior to the liver, likely a biloma. The surgical drain his along the posterior aspect of this collection. 2. There is a smaller fluid collection within the cholecystectomy bed which is nonspecific. There is also a small amount fluid within the subhepatic space. 3. Decompressed biliary system and pneumobilia status post biliary stent placement. 4. Right pleural effusion with associated subtotal right lower lobe pulmonary collapse.   Electronically Signed   By: BCamie PatienceM.D.   On: 10/27/2013 15:44   [place]  10/28/2013   INDICATION: 48year old female status post right liver lobe hemangioma resection. She has developed fevers and pain at home, with concern for right upper quadrant biliary leak. She has been referred for drainage.  EXAM: [place]  COMPARISON:  CT 10/27/2013.  Nuclear medicine  study 10/17/2013  MEDICATIONS: The patient is currently admitted to the hospital and receiving intravenous antibiotics. The antibiotics were administered within an appropriate time frame prior to the initiation of the procedure.  ANESTHESIA/SEDATION: Fentanyl 100 mcg IV; Versed 2 mg IV  Total  Moderate Sedation time  Thirty minutes  CONTRAST:  None  COMPLICATIONS: None immediate  PROCEDURE: Informed written consent was obtained from the patient and the patient's family after a discussion of the risks, benefits and alternatives to treatment. The patient was placed in the left decubitus position on the ultrasound table and a ultrasound survey was completed of the right upper quadrant with images stored and sent to PACs. The procedure was planned. A timeout was performed prior to the initiation of the procedure.  The right flank was prepped and draped in the usual sterile fashion. The overlying soft tissues were anesthetized with 1% lidocaine with epinephrine. An 18 gauge trocar needle was advanced into the right upper quadrant fluid collection adjacent to the liver under ultrasound guidance, and position was confirmed with aspiration of bilious fluid. A short Amplatz super stiff wire was then advanced into the collection.  The tract was serially dilated allowing placement of a 10 [place] all-purpose drainage catheter.  5 cc of ileus fluid was then aspirated. The tube was connected to a bulb suction and sutured in place. A dressing was placed. The patient tolerated the procedure well without immediate post procedural complication.  IMPRESSION: Status post ultrasound-guided drainage of right upper quadrant fluid collection with 10 French pigtail drain. Sample of fluid was sent to the lab for complete analysis.  Signed,  Dulcy Fanny. Earleen Newport, DO  [facility]  [facility]   Electronically Signed   By: Corrie Mckusick O.D.   On: 10/28/2013 17:50   Ct Image Guided Fluid Drain By Catheter  10/28/2013   CLINICAL DATA:  48 year old female with a history of right liver lobe hemangioma resection. She has developed fevers and suspicion bile leak.  EXAM: CT GUIDED drainage of abdominal fluid collection  ANESTHESIA/SEDATION: One  Mg IV Versed; 25 mcg IV Fentanyl  Total Moderate  Sedation Time: 12 minutes.  PROCEDURE: The procedure risks, benefits, and alternatives were explained to the patient. Questions regarding the procedure were encouraged and answered. The patient understands and consents to the procedure.  The patient was positioned and left decubitus on the CT gantry table. Scout CT of the abdomen was performed.  The right flank in the indwelling drain was prepped with Betadinein a sterile fashion, and a sterile drape was applied covering the operative field. A sterile gown and sterile gloves were used for the procedure. Local anesthesia was provided with 1% Lidocaine.  The indwelling drain was cut and a Amplatz wire was advanced to the drain and position. A CT image confirmed position of the wire and drain within the right upper quadrant fluid collection. The drain was then exchanged for a new 10 French 25 cm pigtail catheter.  Again, CT image confirmed a new drain in position within this fluid collection. Approximately 30 cc of yellowish fluid was aspirated and sent for analysis.  The drain was then sutured in position with a single Prolene retention suture as well as a StatLock.  Complications: None  FINDINGS: Indwelling drain, previously placed with ultrasound, with radiopaque marker outside of the abdominal fascia.  Images during the case demonstrate modified Seldinger technique for exchange of the drain.  Final image demonstrates a new 10.2 French pigtail catheter and right  upper quadrant fluid collection.  30 cc of thick yellowish fluid was aspirated.  IMPRESSION: Status post CT-guided drain exchange of right upper quadrant fluid collection. Sample of fluid sent to lab for analysis.  Signed,  Dulcy Fanny. Earleen Newport, DO  [facility]  [facility]   Electronically Signed   By: Corrie Mckusick O.D.   On: 10/28/2013 18:29   US Thoracentesis Asp Pleural Space W/img Guide  10/28/2013   CLINICAL DATA:  48 year old female status post liver tumor  resection. She has developed fever and abdominal pain at home. Recent CT demonstrates right-sided pleural effusion as well as right upper quadrant fluid collection.  EXAM: ULTRASOUND GUIDED right THORACENTESIS  COMPARISON:  CT of the abdomen 10/27/2013.  PROCEDURE: An ultrasound guided thoracentesis was thoroughly discussed with the patient and questions answered. The benefits, risks, alternatives and complications were also discussed. The patient understands and wishes to proceed with the procedure. Written consent was obtained.  Ultrasound was performed to localize and mark an adequate pocket of fluid in the right chest. The area was then prepped and draped in the normal sterile fashion. 1% Lidocaine was used for local anesthesia. Under ultrasound guidance a 19 gauge Yueh catheter was introduced. Thoracentesis was performed. The catheter was removed and a dressing applied.  Complications:  None  FINDINGS: A total of approximately 450 cc of yellowish fluid was removed. A fluid sample wassent for laboratory analysis.  IMPRESSION: Status post right-sided thoracentesis with ultrasound guidance yielding 450 cc of yellowish fluid. A sample was sent the lab for analysis.  Signed,  Dulcy Fanny. Earleen Newport, DO  [facility]  [facility]   Electronically Signed   By: Corrie Mckusick O.D.   On: 10/28/2013 17:44    Labs: Results for orders placed during the hospital encounter of 10/28/13 (from the past 48 hour(s))  CBC     Status: Abnormal   Collection Time    10/31/13  5:45 AM      Result Value Ref Range   WBC 6.2  4.0 - 10.5 K/uL   RBC 3.65 (*) 3.87 - 5.11 MIL/uL   Hemoglobin 10.2 (*) 12.0 - 15.0 g/dL   HCT 31.8 (*) 36.0 - 46.0 %   MCV 87.1  78.0 - 100.0 fL   MCH 27.9  26.0 - 34.0 pg   MCHC 32.1  30.0 - 36.0 g/dL   RDW 15.0  11.5 - 15.5 %   Platelets 313  150 - 400 K/uL  COMPREHENSIVE METABOLIC PANEL     Status: Abnormal   Collection Time    10/31/13  5:45 AM       Result Value Ref Range   Sodium 139  137 - 147 mEq/L   Potassium 3.5 (*) 3.7 - 5.3 mEq/L   Chloride 102  96 - 112 mEq/L   CO2 24  19 - 32 mEq/L   Glucose, Bld 105 (*) 70 - 99 mg/dL   BUN 7  6 - 23 mg/dL   Creatinine, Ser 0.62  0.50 - 1.10 mg/dL   Calcium 8.2 (*) 8.4 - 10.5 mg/dL   Total Protein 5.8 (*) 6.0 - 8.3 g/dL   Albumin 1.9 (*) 3.5 - 5.2 g/dL   AST 15  0 - 37 U/L   ALT 11  0 - 35 U/L   Alkaline Phosphatase 110  39 - 117 U/L   Total Bilirubin 0.3  0.3 - 1.2 mg/dL   GFR calc non Af Amer >90  >  90 mL/min   GFR calc Af Amer >90  >90 mL/min   Comment: (NOTE)     The eGFR has been calculated using the CKD EPI equation.     This calculation has not been validated in all clinical situations.     eGFR's persistently <90 mL/min signify possible Chronic Kidney     Disease.   Anion gap 13  5 - 15    Assessment and Plan:  Surgical drain: RLQ intact IR drain: R flank intact Some better daily Still abd pain Will follow with [facility]  Lavonia Drafts PAC-IR   I spent a total of 15 minutes face to face in clinical consultation/evaluation, greater than 50% of which was counseling/coordinating care for Biloma/abscess drains post surgery

## 2013-11-01 ENCOUNTER — Inpatient Hospital Stay (HOSPITAL_COMMUNITY): Payer: No Typology Code available for payment source

## 2013-11-01 LAB — CBC
HCT: 32.5 % — ABNORMAL LOW (ref 36.0–46.0)
Hemoglobin: 10.3 g/dL — ABNORMAL LOW (ref 12.0–15.0)
MCH: 27.8 pg (ref 26.0–34.0)
MCHC: 31.7 g/dL (ref 30.0–36.0)
MCV: 87.6 fL (ref 78.0–100.0)
PLATELETS: 333 10*3/uL (ref 150–400)
RBC: 3.71 MIL/uL — ABNORMAL LOW (ref 3.87–5.11)
RDW: 15.1 % (ref 11.5–15.5)
WBC: 5.3 10*3/uL (ref 4.0–10.5)

## 2013-11-01 LAB — BODY FLUID CULTURE: Culture: NO GROWTH

## 2013-11-01 MED ORDER — AMOXICILLIN-POT CLAVULANATE 500-125 MG PO TABS
1.0000 | ORAL_TABLET | Freq: Three times a day (TID) | ORAL | Status: DC
  Administered 2013-11-01 – 2013-11-02 (×3): 500 mg via ORAL
  Filled 2013-11-01 (×5): qty 1

## 2013-11-01 MED ORDER — FUROSEMIDE 40 MG PO TABS
40.0000 mg | ORAL_TABLET | Freq: Every day | ORAL | Status: DC
  Administered 2013-11-01 – 2013-11-02 (×2): 40 mg via ORAL
  Filled 2013-11-01 (×2): qty 1

## 2013-11-01 MED ORDER — LIDOCAINE HCL (PF) 1 % IJ SOLN
INTRAMUSCULAR | Status: AC
  Filled 2013-11-01: qty 10

## 2013-11-01 NOTE — Progress Notes (Signed)
Patient ID: Deborah Trujillo, female   DOB: 06/16/1965, 48 y.o.   MRN: [id number]   Referring Physician(s): Dr Stark Klein  Subjective:  RLQ surgical drain intact Very tender abd Rt flank drain intact tender at site  Allergies: Review of patient's allergies indicates no known allergies.  Medications: Prior to Admission medications   Medication Sig Start Date End Date Taking? Authorizing Provider  amoxicillin-clavulanate (AUGMENTIN) 875-125 MG per tablet Take 1 tablet by mouth every 12 (twelve) hours. 10/21/13  Yes Stark Klein, MD  colestipol (COLESTID) 1 G tablet Take 2 tablets (2 g total) by mouth 3 (three) times daily. 10/12/13  Yes Stark Klein, MD  omeprazole (PRILOSEC) 20 MG capsule Take 1 capsule (20 mg total) by mouth daily. 10/21/13  Yes Stark Klein, MD  oxyCODONE (OXY IR/ROXICODONE) 5 MG immediate release tablet Take 1-2 tablets (5-10 mg total) by mouth every 4 (four) hours as needed for moderate pain, severe pain or breakthrough pain. 10/21/13  Yes Stark Klein, MD    Review of Systems  Vital Signs: BP 96/50  Pulse 76  Temp(Src) 98.2 F (36.8 C) (Oral)  Resp 16  Ht 4' 11"  (1.499 m)  Wt 69.219 kg (152 lb 9.6 oz)  BMI 30.81 kg/m2  SpO2 92%  Physical Exam  Abdominal:  Both drain in place Output of RLQ surgical drain minimal ++ tender  Rt flank drain intact Output 50 cc yesterday 20 cc in now Yellow color Clean and dry + tender  Wbc 5.4; afeb    Imaging: Ct Abdomen Wo Contrast  10/28/2013   CLINICAL DATA:  48 year old female status post liver hemangioma resection. She has had increasing abdominal pain and fevers at home. Recent CT scan demonstrates intra abdominal fluid collection which is suspicious for a bile leak on a nuclear medicine study.  The patient has undergone right thoracentesis as well as ultrasound-guided drainage of the abdominal fluid on today's date.  EXAM: CT ABDOMEN WITHOUT CONTRAST  TECHNIQUE: Multidetector CT imaging of the abdomen  was performed following the standard protocol without IV contrast.  COMPARISON:  Abdominal CT 10/27/2013  FINDINGS: Lower chest:  Unremarkable appearance of the skin subcutaneous tissue the chest. Surgical changes of prior abdominal laparotomy.  Percutaneous drain in the intercostal space of the right ninth and tenth ribs with subcutaneous gas associated with recent placement.  Heart size within normal limits. Minimal pericardial fluid/thickening.  Compared to the prior CT there has been evacuation of the right-sided pleural fluid. Associated atelectatic changes are present at the right base. Minimal atelectasis at the will left base.  Abdomen:  Fluid and gas collection within the left upper abdomen measures approximately 5.6 cm x 4.7 cm on today's date. Surgical changes are present along the margin of the fluid collection compatible the re- since prior liver surgery. The percutaneous drain traverses the fluid collection and terminates in the hilum of the liver.  Surgical drain from the anterior abdomen terminates at the dome of the liver on the lateral aspect of fluid collection.  The separate fluid collection within the right lobe of the liver measures 19 mm x 41 mm on current study, similar to comparison CT.  Biliary stent appears to have migrated into the duodenum as compared to the prior CT.  No abnormally distended small bowel or colon. Retained oral contrast within the colon.  No evidence of free intraperitoneal air.  No displaced fracture.  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  Surgical changes of prior right liver lobe hemangioma resection, with surgical drain from the anterior abdomen and soft tissue changes of the right upper quadrant.  Interval resolution of right-sided pleural fluid status post right thoracentesis on today's date.  Biliary stent has migrated into the duodenum.   Signed,  Dulcy Fanny. Earleen Newport, DO  [facility]  [facility]   Electronically Signed   By: Corrie Mckusick O.D.   On: 10/28/2013 17:37   Dg Chest 2 View  10/31/2013   CLINICAL DATA:  Follow-up right pleural effusion. Right chest wall pain. Cough. Fever. Indwelling drainage catheter within a right upper quadrant abdominal fluid collection.  EXAM: CHEST  2 VIEW  COMPARISON:  Right lateral decubitus chest x-ray obtained concurrently. Two-view chest x-ray 10/11/2013. Images of the lung bases on CT abdomen and pelvis 9/16/1,015.  FINDINGS: Cardiac silhouette normal in size, unchanged. Thoracic aorta mildly atherosclerotic. Hilar and mediastinal contours otherwise unremarkable. Large right pleural effusion with dense consolidation in the right middle and right lower lobe, slightly improved since the 09/1928 02/2013 examination. Mild atelectasis involving the left lower lobe, with improved aeration since the prior examination. No new pulmonary parenchymal abnormalities. Visualized bony thorax intact. Pigtail drainage catheter and surgical drain in the right upper quadrant of the abdomen. Indwelling endoscopic stent in the biliary tree.  IMPRESSION: Large right pleural effusion and dense passive atelectasis and/or pneumonia involving the right lower lobe and right middle lobe, slightly improved since 10/11/2013. Mild left basilar atelectasis, also improved. No new abnormalities.   Electronically Signed   By: Evangeline Dakin M.D.   On: 10/31/2013 12:08   Dg Chest Right Decubitus  10/31/2013   CLINICAL DATA:  Right pleural effusion.  EXAM: CHEST - RIGHT DECUBITUS  COMPARISON:  Two-view chest x-ray obtained concurrently.  FINDINGS: The large right pleural effusion layers freely on the decubitus image.  IMPRESSION: Freely layering large right pleural effusion.   Electronically Signed   By: Evangeline Dakin M.D.   On: 10/31/2013 12:08   US Aspiration  10/28/2013   INDICATION:  48 year old female status post right liver lobe hemangioma resection. She has developed fevers and pain at home, with concern for right upper quadrant biliary leak. She has been referred for drainage.  EXAM: US ASPIRATION  COMPARISON:  CT 10/27/2013.  Nuclear medicine study 10/17/2013  MEDICATIONS: The patient is currently admitted to the hospital and receiving intravenous antibiotics. The antibiotics were administered within an appropriate time frame prior to the initiation of the procedure.  ANESTHESIA/SEDATION: Fentanyl 100 mcg IV; Versed 2 mg IV  Total Moderate Sedation time  Thirty minutes  CONTRAST:  None  COMPLICATIONS: None immediate  PROCEDURE: Informed written consent was obtained from the patient and the patient's family after a discussion of the risks, benefits and alternatives to treatment. The patient was placed in the left decubitus position on the ultrasound table and a ultrasound survey was completed of the right upper quadrant with images stored and sent to PACs. The procedure was planned. A timeout was performed prior to the initiation of the procedure.  The right flank was prepped and draped in the usual sterile fashion. The overlying soft tissues were anesthetized with 1% lidocaine with epinephrine. An 18 gauge trocar needle was advanced into the right upper quadrant fluid collection adjacent to the liver under ultrasound guidance, and position was confirmed with aspiration of bilious fluid. A short Amplatz super stiff wire was then advanced into the collection.  The  tract was serially dilated allowing placement of a 10 [place] all-purpose drainage catheter.  5 cc of ileus fluid was then aspirated. The tube was connected to a bulb suction and sutured in place. A dressing was placed. The patient tolerated the procedure well without immediate post procedural complication.  IMPRESSION: Status post ultrasound-guided drainage of right upper quadrant fluid collection with 10 French pigtail drain.  Sample of fluid was sent to the lab for complete analysis.  Signed,  Dulcy Fanny. Earleen Newport, DO  [facility]  [facility]   Electronically Signed   By: Corrie Mckusick O.D.   On: 10/28/2013 17:50   Ct Image Guided Fluid Drain By Catheter  10/28/2013   CLINICAL DATA:  48 year old female with a history of right liver lobe hemangioma resection. She has developed fevers and suspicion bile leak.  EXAM: CT GUIDED drainage of abdominal fluid collection  ANESTHESIA/SEDATION: One  Mg IV Versed; 25 mcg IV Fentanyl  Total Moderate Sedation Time: 12 minutes.  PROCEDURE: The procedure risks, benefits, and alternatives were explained to the patient. Questions regarding the procedure were encouraged and answered. The patient understands and consents to the procedure.  The patient was positioned and left decubitus on the CT gantry table. Scout CT of the abdomen was performed.  The right flank in the indwelling drain was prepped with Betadinein a sterile fashion, and a sterile drape was applied covering the operative field. A sterile gown and sterile gloves were used for the procedure. Local anesthesia was provided with 1% Lidocaine.  The indwelling drain was cut and a Amplatz wire was advanced to the drain and position. A CT image confirmed position of the wire and drain within the right upper quadrant fluid collection. The drain was then exchanged for a new 10 French 25 cm pigtail catheter.  Again, CT image confirmed a new drain in position within this fluid collection. Approximately 30 cc of yellowish fluid was aspirated and sent for analysis.  The drain was then sutured in position with a single Prolene retention suture as well as a StatLock.  Complications: None  FINDINGS: Indwelling drain, previously placed with ultrasound, with radiopaque marker outside of the abdominal fascia.  Images during the case demonstrate modified Seldinger technique for exchange of the drain.  Final image  demonstrates a new 10.2 French pigtail catheter and right upper quadrant fluid collection.  30 cc of thick yellowish fluid was aspirated.  IMPRESSION: Status post CT-guided drain exchange of right upper quadrant fluid collection. Sample of fluid sent to lab for analysis.  Signed,  Dulcy Fanny. Earleen Newport, DO  [facility]  [facility]   Electronically Signed   By: Corrie Mckusick O.D.   On: 10/28/2013 18:29   US Thoracentesis Asp Pleural Space W/img Guide  10/28/2013   CLINICAL DATA:  48 year old female status post liver tumor resection. She has developed fever and abdominal pain at home. Recent CT demonstrates right-sided pleural effusion as well as right upper quadrant fluid collection.  EXAM: ULTRASOUND GUIDED right THORACENTESIS  COMPARISON:  CT of the abdomen 10/27/2013.  PROCEDURE: An ultrasound guided thoracentesis was thoroughly discussed with the patient and questions answered. The benefits, risks, alternatives and complications were also discussed. The patient understands and wishes to proceed with the procedure. Written consent was obtained.  Ultrasound was performed to localize and mark an adequate pocket of fluid in the right chest. The area was then prepped and draped in the normal sterile fashion. 1% Lidocaine was used  for local anesthesia. Under ultrasound guidance a 19 gauge Yueh catheter was introduced. Thoracentesis was performed. The catheter was removed and a dressing applied.  Complications:  None  FINDINGS: A total of approximately 450 cc of yellowish fluid was removed. A fluid sample wassent for laboratory analysis.  IMPRESSION: Status post right-sided thoracentesis with ultrasound guidance yielding 450 cc of yellowish fluid. A sample was sent the lab for analysis.  Signed,  Dulcy Fanny. Earleen Newport, DO  [facility]  [facility]   Electronically Signed   By: Corrie Mckusick O.D.   On: 10/28/2013 17:44     Labs: Results for orders placed during the hospital encounter of 10/28/13 (from the past 48 hour(s))  CBC     Status: Abnormal   Collection Time    10/31/13  5:45 AM      Result Value Ref Range   WBC 6.2  4.0 - 10.5 K/uL   RBC 3.65 (*) 3.87 - 5.11 MIL/uL   Hemoglobin 10.2 (*) 12.0 - 15.0 g/dL   HCT 31.8 (*) 36.0 - 46.0 %   MCV 87.1  78.0 - 100.0 fL   MCH 27.9  26.0 - 34.0 pg   MCHC 32.1  30.0 - 36.0 g/dL   RDW 15.0  11.5 - 15.5 %   Platelets 313  150 - 400 K/uL  COMPREHENSIVE METABOLIC PANEL     Status: Abnormal   Collection Time    10/31/13  5:45 AM      Result Value Ref Range   Sodium 139  137 - 147 mEq/L   Potassium 3.5 (*) 3.7 - 5.3 mEq/L   Chloride 102  96 - 112 mEq/L   CO2 24  19 - 32 mEq/L   Glucose, Bld 105 (*) 70 - 99 mg/dL   BUN 7  6 - 23 mg/dL   Creatinine, Ser 0.62  0.50 - 1.10 mg/dL   Calcium 8.2 (*) 8.4 - 10.5 mg/dL   Total Protein 5.8 (*) 6.0 - 8.3 g/dL   Albumin 1.9 (*) 3.5 - 5.2 g/dL   AST 15  0 - 37 U/L   ALT 11  0 - 35 U/L   Alkaline Phosphatase 110  39 - 117 U/L   Total Bilirubin 0.3  0.3 - 1.2 mg/dL   GFR calc non Af Amer >90  >90 mL/min   GFR calc Af Amer >90  >90 mL/min   Comment: (NOTE)     The eGFR has been calculated using the CKD EPI equation.     This calculation has not been validated in all clinical situations.     eGFR's persistently <90 mL/min signify possible Chronic Kidney     Disease.   Anion gap 13  5 - 15  CBC     Status: Abnormal   Collection Time    11/01/13  8:45 AM      Result Value Ref Range   WBC 5.3  4.0 - 10.5 K/uL   RBC 3.71 (*) 3.87 - 5.11 MIL/uL   Hemoglobin 10.3 (*) 12.0 - 15.0 g/dL   HCT 32.5 (*) 36.0 - 46.0 %   MCV 87.6  78.0 - 100.0 fL   MCH 27.8  26.0 - 34.0 pg   MCHC 31.7  30.0 - 36.0 g/dL   RDW 15.1  11.5 - 15.5 %   Platelets 333  150 - 400 K/uL    Assessment and Plan:  Rt liver hemangioma and cholecystectomy 09/30/13 Post surgical abscesses; biloma Surgical drain  in place RLQ--minimal output Rt  flank IR drain- output still significant Very tender still at abd and flank  Also scheduled today for R Thoracentesis Consent signed andin chart  Lavonia Drafts PAC-IR   I spent a total of 20 minutes face to face in clinical consultation/evaluation, greater than 50% of which was counseling/coordinating care for abscess drain check

## 2013-11-01 NOTE — Progress Notes (Signed)
Patient ID: Deborah Trujillo, female   DOB: [birth year], 48 y.o.   MRN: [id number]   [place] limited chest performed  No fluid noted  No thoracentesis performed Interpreter [id number] explained to pt and dtr All questions answered to satisfaction  Pt sent back to room

## 2013-11-02 ENCOUNTER — Other Ambulatory Visit (INDEPENDENT_AMBULATORY_CARE_PROVIDER_SITE_OTHER): Payer: Self-pay | Admitting: General Surgery

## 2013-11-02 DIAGNOSIS — D1803 Hemangioma of intra-abdominal structures: Secondary | ICD-10-CM

## 2013-11-02 LAB — ANAEROBIC CULTURE
Gram Stain: NONE SEEN
SPECIAL REQUESTS: NORMAL
Special Requests: NORMAL

## 2013-11-02 MED ORDER — OXYCODONE HCL 5 MG PO TABS: 5.0000 mg | ORAL_TABLET | ORAL | Status: AC | PRN

## 2013-11-02 MED ORDER — SENNOSIDES-DOCUSATE SODIUM 8.6-50 MG PO TABS: 1.0000 | ORAL_TABLET | Freq: Every day | ORAL | Status: AC

## 2013-11-02 NOTE — Discharge Instructions (Signed)
Measure and record drain output 1-2 times/day.    Bring record to clinic.  Use incentive spirometer 6 times per day for around 10 deep breaths.  Follow up in 2 weeks with CT scan.

## 2013-11-02 NOTE — Progress Notes (Signed)
Patient discharged home in stable condition. Verbalizes understanding of all discharge instructions, including home medications and follow up appointments. 

## 2013-11-08 NOTE — Discharge Summary (Signed)
Physician Discharge Summary  Patient ID: Deborah Trujillo MRN: [id number] DOB/AGE: Oct 12, 1965 48 y.o.  Admit date: 10/28/2013 Discharge date: 11/08/2013  Admission Diagnoses: Biloma Hemangioma Right pleural effusion   Discharge Diagnoses:  Same  Discharged Condition: stable  Hospital Course:  Pt was admitted to the floor following R thoracentesis and placement of new drain in the right upper quadrant. This grew GNR.  She was placed on IV antibiotics.  She continued to complain of significant right flank pain.  She did not have any fevers after this.  She has issues with sats in the low 90s.  She had constipation as well.  She did have a decubitus cxr over the weekend that appeared to have a recurrent pleural effusion.    Consults: interventional radiology  Significant Diagnostic Studies: labs: WBCs 5.3 prior to discharge  Treatments: perc drain  Discharge Exam: Blood pressure 108/68, pulse 91, temperature 98.4 F (36.9 C), temperature source Oral, resp. rate 20, height 4\' 11"  (1.499 m), weight 152 lb 9.6 oz (69.219 kg), SpO2 91.00%. General appearance: alert, cooperative and mild distress Resp: breathing comfortably GI: soft, non distended, approp tender at drain site  Disposition: 01-Home or Self Care  Discharge Instructions   Call MD for:  persistant nausea and vomiting    Complete by:  As directed      Call MD for:  redness, tenderness, or signs of infection (pain, swelling, redness, odor or green/yellow discharge around incision site)    Complete by:  As directed      Call MD for:  severe uncontrolled pain    Complete by:  As directed      Call MD for:  temperature >100.4    Complete by:  As directed      Diet - low sodium heart healthy    Complete by:  As directed      Increase activity slowly    Complete by:  As directed             Medication List    STOP taking these medications       colestipol 1 G tablet  Commonly known as:  COLESTID      TAKE  these medications       amoxicillin-clavulanate 875-125 MG per tablet  Commonly known as:  AUGMENTIN  Take 1 tablet by mouth every 12 (twelve) hours.     omeprazole 20 MG capsule  Commonly known as:  PRILOSEC  Take 1 capsule (20 mg total) by mouth daily.     oxyCODONE 5 MG immediate release tablet  Commonly known as:  Oxy IR/ROXICODONE  Take 1-2 tablets (5-10 mg total) by mouth every 4 (four) hours as needed for moderate pain, severe pain or breakthrough pain.     senna-docusate 8.6-50 MG per tablet  Commonly known as:  Senokot-S  Take 1 tablet by mouth daily.         SignedStark Klein 11/08/2013, 7:23 PM

## 2013-11-15 ENCOUNTER — Telehealth (INDEPENDENT_AMBULATORY_CARE_PROVIDER_SITE_OTHER): Payer: Self-pay

## 2013-11-15 NOTE — Telephone Encounter (Signed)
Pts care giver calling for refill on oxycodone 5mg . Pt pain level 6. States voiding and BMs normal. No fever. Advised will send request to Urgent MD clinic this pm for review since Dr Barry Dienes off. Pt can be reached at [phone].

## 2013-11-15 NOTE — Telephone Encounter (Signed)
Will print and give to urg md to do rx.

## 2013-11-15 NOTE — Telephone Encounter (Signed)
Ok tto refill, #50

## 2013-11-17 ENCOUNTER — Ambulatory Visit
Admit: 2013-11-17 | Discharge: 2013-11-17 | Disposition: A | Discharge status: Home / Self Care | Payer: No Typology Code available for payment source | Attending: General Surgery | Admitting: General Surgery

## 2013-11-17 DIAGNOSIS — D1803 Hemangioma of intra-abdominal structures: Secondary | ICD-10-CM

## 2013-11-17 MED ORDER — IOHEXOL 300 MG/ML  SOLN
100.0000 mL | Freq: Once | INTRAMUSCULAR | Status: AC | PRN
  Administered 2013-11-17: 100 mL via INTRAVENOUS

## 2013-12-10 ENCOUNTER — Encounter (HOSPITAL_COMMUNITY): Payer: Self-pay | Admitting: *Deleted

## 2013-12-13 NOTE — Progress Notes (Addendum)
12-13-13 1115 Left message for pt [phone], date and procedure time change. Please arrive at admitting 0900 AM 0n 12-15-13 , Spanish Interpreter should be on site. Email to [employer] to make aware of change. [employer] John(ID # [id number]) to communicate new time and date with son("Jesus"), with confirmed understanding to arrive by 0900 AM to admitting [facility] 12-15-13< nothing by mouth except sip water to take Omeprazole.

## 2013-12-14 ENCOUNTER — Other Ambulatory Visit: Payer: Self-pay | Admitting: Gastroenterology

## 2013-12-15 ENCOUNTER — Ambulatory Visit (HOSPITAL_COMMUNITY)
Admission: RE | Admit: 2013-12-15 | Discharge: 2013-12-15 | Disposition: A | Discharge status: Home / Self Care | Payer: No Typology Code available for payment source | Source: Ambulatory Visit | Attending: Gastroenterology | Admitting: Gastroenterology

## 2013-12-15 ENCOUNTER — Encounter (HOSPITAL_COMMUNITY): Payer: Self-pay | Admitting: Registered Nurse

## 2013-12-15 ENCOUNTER — Ambulatory Visit (HOSPITAL_COMMUNITY): Payer: Self-pay | Admitting: Anesthesiology

## 2013-12-15 ENCOUNTER — Encounter (HOSPITAL_COMMUNITY)
Admission: RE | Disposition: A | Discharge status: Home / Self Care | Payer: No Typology Code available for payment source | Source: Ambulatory Visit | Attending: Gastroenterology

## 2013-12-15 ENCOUNTER — Ambulatory Visit (HOSPITAL_COMMUNITY)
Admission: RE | Admit: 2013-12-15 | Discharge: 2013-12-15 | Disposition: A | Discharge status: Home / Self Care | Payer: Self-pay | Source: Ambulatory Visit | Attending: Gastroenterology | Admitting: Gastroenterology

## 2013-12-15 DIAGNOSIS — K219 Gastro-esophageal reflux disease without esophagitis: Secondary | ICD-10-CM | POA: Insufficient documentation

## 2013-12-15 DIAGNOSIS — R000 Tachycardia, unspecified: Secondary | ICD-10-CM | POA: Insufficient documentation

## 2013-12-15 DIAGNOSIS — D1809 Hemangioma of other sites: Secondary | ICD-10-CM | POA: Insufficient documentation

## 2013-12-15 DIAGNOSIS — K838 Other specified diseases of biliary tract: Secondary | ICD-10-CM | POA: Insufficient documentation

## 2013-12-15 DIAGNOSIS — Y92530 Ambulatory surgery center as the place of occurrence of the external cause: Secondary | ICD-10-CM | POA: Insufficient documentation

## 2013-12-15 DIAGNOSIS — Z4689 Encounter for fitting and adjustment of other specified devices: Secondary | ICD-10-CM | POA: Insufficient documentation

## 2013-12-15 DIAGNOSIS — T368X5A Adverse effect of other systemic antibiotics, initial encounter: Secondary | ICD-10-CM | POA: Insufficient documentation

## 2013-12-15 DIAGNOSIS — K862 Cyst of pancreas: Secondary | ICD-10-CM | POA: Insufficient documentation

## 2013-12-15 HISTORY — PX: GASTROINTESTINAL STENT REMOVAL: SHX6384

## 2013-12-15 HISTORY — DX: Gastro-esophageal reflux disease without esophagitis: K21.9

## 2013-12-15 HISTORY — PX: ERCP: SHX5425

## 2013-12-15 SURGERY — ERCP, WITH INTERVENTION IF INDICATED
Anesthesia: General

## 2013-12-15 MED ORDER — LIDOCAINE HCL (CARDIAC) 20 MG/ML IV SOLN
INTRAVENOUS | Status: AC
  Filled 2013-12-15: qty 5

## 2013-12-15 MED ORDER — MIDAZOLAM HCL 2 MG/2ML IJ SOLN
INTRAMUSCULAR | Status: AC
  Filled 2013-12-15: qty 2

## 2013-12-15 MED ORDER — SUCCINYLCHOLINE CHLORIDE 20 MG/ML IJ SOLN
INTRAMUSCULAR | Status: DC | PRN
  Administered 2013-12-15: 100 mg via INTRAVENOUS

## 2013-12-15 MED ORDER — CIPROFLOXACIN IN D5W 400 MG/200ML IV SOLN
INTRAVENOUS | Status: AC
  Filled 2013-12-15: qty 200

## 2013-12-15 MED ORDER — LACTATED RINGERS IV SOLN
INTRAVENOUS | Status: DC
  Administered 2013-12-15: 11:00:00 via INTRAVENOUS

## 2013-12-15 MED ORDER — ONDANSETRON HCL 4 MG/2ML IJ SOLN
INTRAMUSCULAR | Status: AC
  Filled 2013-12-15: qty 2

## 2013-12-15 MED ORDER — FENTANYL CITRATE 0.05 MG/ML IJ SOLN
INTRAMUSCULAR | Status: AC
  Filled 2013-12-15: qty 2

## 2013-12-15 MED ORDER — GLUCAGON HCL RDNA (DIAGNOSTIC) 1 MG IJ SOLR
INTRAMUSCULAR | Status: AC
  Filled 2013-12-15: qty 1

## 2013-12-15 MED ORDER — SODIUM CHLORIDE 0.9 % IV SOLN: INTRAVENOUS | Status: DC

## 2013-12-15 MED ORDER — CIPROFLOXACIN IN D5W 400 MG/200ML IV SOLN
400.0000 mg | Freq: Two times a day (BID) | INTRAVENOUS | Status: DC
  Administered 2013-12-15: 400 mg via INTRAVENOUS

## 2013-12-15 MED ORDER — PROPOFOL 10 MG/ML IV BOLUS
INTRAVENOUS | Status: AC
  Filled 2013-12-15: qty 20

## 2013-12-15 MED ORDER — LACTATED RINGERS IV SOLN: INTRAVENOUS | Status: DC | PRN

## 2013-12-15 MED ORDER — FENTANYL CITRATE 0.05 MG/ML IJ SOLN
INTRAMUSCULAR | Status: DC | PRN
  Administered 2013-12-15: 50 ug via INTRAVENOUS

## 2013-12-15 MED ORDER — MIDAZOLAM HCL 5 MG/5ML IJ SOLN
INTRAMUSCULAR | Status: DC | PRN
  Administered 2013-12-15: 2 mg via INTRAVENOUS

## 2013-12-15 MED ORDER — LIDOCAINE HCL (CARDIAC) 20 MG/ML IV SOLN
INTRAVENOUS | Status: DC | PRN
  Administered 2013-12-15: 100 mg via INTRAVENOUS

## 2013-12-15 MED ORDER — PIPERACILLIN-TAZOBACTAM 3.375 G IVPB 30 MIN
3.3750 g | Freq: Once | INTRAVENOUS | Status: AC
  Administered 2013-12-15: 3.375 g via INTRAVENOUS
  Filled 2013-12-15: qty 50

## 2013-12-15 MED ORDER — ONDANSETRON HCL 4 MG/2ML IJ SOLN
INTRAMUSCULAR | Status: DC | PRN
  Administered 2013-12-15: 4 mg via INTRAVENOUS

## 2013-12-15 MED ORDER — IOHEXOL 350 MG/ML SOLN
INTRAVENOUS | Status: DC | PRN
  Administered 2013-12-15: 12:00:00

## 2013-12-15 MED ORDER — PROPOFOL 10 MG/ML IV BOLUS
INTRAVENOUS | Status: DC | PRN
  Administered 2013-12-15: 150 mg via INTRAVENOUS

## 2013-12-15 NOTE — Anesthesia Preprocedure Evaluation (Addendum)
Anesthesia Evaluation  Patient identified by MRN, date of birth, ID band Patient awake    Reviewed: Allergy & Precautions, H&P , NPO status , Patient's Chart, lab work & pertinent test results  Airway Mallampati: II  TM Distance: >3 FB Neck ROM: Full    Dental no notable dental hx. (+) Teeth Intact, Dental Advisory Given   Pulmonary neg pulmonary ROS,  breath sounds clear to auscultation  Pulmonary exam normal       Cardiovascular Exercise Tolerance: Good negative cardio ROS  Rhythm:Regular Rate:Normal     Neuro/Psych negative neurological ROS  negative psych ROS   GI/Hepatic negative GI ROS, GERD-  Medicated and Controlled,Hemangioma liver Pancreatic cyst   Endo/Other  negative endocrine ROS  Renal/GU negative Renal ROS  negative genitourinary   Musculoskeletal negative musculoskeletal ROS (+)   Abdominal   Peds  Hematology negative hematology ROS (+)   Anesthesia Other Findings   Reproductive/Obstetrics negative OB ROS                           Anesthesia Physical Anesthesia Plan  ASA: II  Anesthesia Plan: General   Post-op Pain Management:    Induction:   Airway Management Planned: Oral ETT  Additional Equipment:   Intra-op Plan:   Post-operative Plan: Extubation in OR  Informed Consent: I have reviewed the patients History and Physical, chart, labs and discussed the procedure including the risks, benefits and alternatives for the proposed anesthesia with the patient or authorized representative who has indicated his/her understanding and acceptance.   Dental Advisory Given  Plan Discussed with: CRNA and Surgeon  Anesthesia Plan Comments:        Anesthesia Quick Evaluation

## 2013-12-15 NOTE — H&P (Signed)
Patient interval history reviewed.  Patient examined again.  There has been no change from documented H/P dated 12/14/13 (scanned into chart from our office) except as documented above.  Assessment:  1.  Bile leak, with biliary stent in place x ~ 8 weeks.  Plan:  1.  ERCP for bile duct stent removal, repeat cholangiogram, possible stent replacement. 2.  Risks (up to and including bleeding, infection, perforation, pancreatitis that can be complicated by infected necrosis and death), benefits (removal of stones, alleviating blockage, decreasing risk of cholangitis or choledocholithiasis-related pancreatitis), and alternatives (watchful waiting, percutaneous transhepatic cholangiography) of ERCP were explained to patient/family in detail and patient elects to proceed.

## 2013-12-15 NOTE — Anesthesia Postprocedure Evaluation (Signed)
  Anesthesia Post-op Note  Patient: Deborah Trujillo  Procedure(s) Performed: Procedure(s) (LRB): ENDOSCOPIC RETROGRADE CHOLANGIOPANCREATOGRAPHY (ERCP) (N/A) GASTROINTESTINAL STENT REMOVAL (N/A)  Patient Location: PACU  Anesthesia Type: General  Level of Consciousness: awake and alert   Airway and Oxygen Therapy: Patient Spontanous Breathing  Post-op Pain: mild  Post-op Assessment: Post-op Vital signs reviewed, Patient's Cardiovascular Status Stable, Respiratory Function Stable, Patent Airway and No signs of Nausea or vomiting  Last Vitals:  Filed Vitals:   12/15/13 1329  BP: 119/65  Pulse: 56  Temp:   Resp: 11    Post-op Vital Signs: stable   Complications: No apparent anesthesia complications

## 2013-12-15 NOTE — Addendum Note (Signed)
Addended by: Arta Silence on: 12/15/2013 10:53 AM   Modules accepted: Orders

## 2013-12-15 NOTE — Transfer of Care (Signed)
Immediate Anesthesia Transfer of Care Note  Patient: Deborah Trujillo  Procedure(s) Performed: Procedure(s): ENDOSCOPIC RETROGRADE CHOLANGIOPANCREATOGRAPHY (ERCP) (N/A) GASTROINTESTINAL STENT REMOVAL (N/A)  Patient Location: PACU and Endoscopy Unit  Anesthesia Type:General  Level of Consciousness: awake, alert , oriented and patient cooperative  Airway & Oxygen Therapy: Patient Spontanous Breathing and Patient connected to face mask oxygen  Post-op Assessment: Report given to PACU RN, Post -op Vital signs reviewed and stable and Patient moving all extremities  Post vital signs: Reviewed and stable  Complications: No apparent anesthesia complications

## 2013-12-15 NOTE — Op Note (Signed)
[facility] [employer] [place], [address]   ERCP PROCEDURE REPORT        EXAM DATE: 12/15/2013  PATIENT NAME:          Deborah Trujillo, Deborah Trujillo          MR #: [id number] BIRTHDATE:       [birth year]     VISIT #:     [id number] ATTENDING:     Arta Silence, MD     STATUS:     outpatient REFERRING MD:       Stark Klein, M.D.  INDICATIONS:  The patient is a 48 yr old female here for an ERCP due to bile leak s/p stent 8 weeks ago. PROCEDURE PERFORMED:     ERCP with foreign body removal  MEDICATIONS:     General Endotracheal Anesthesia; Cipro 400 mg IV (stopped early due to concerns of allergy); Zosyn 3.375 g IV  CONSENT: The patient understands the risks and benefits of the procedure and understands that these risks include, but are not limited to: sedation, allergic reaction, infection, perforation and/or bleeding. Alternative means of evaluation and treatment include, among others: physical exam, x-rays, and/or surgical intervention. The patient elects to proceed with this endoscopic procedure.  DESCRIPTION OF PROCEDURE: Informed was verified with assistance of Spanish interpreter, confirmed and timeout was successfully executed by the treatment team. With the patient in left semi-prone position, medications were administered intravenously.The side-viewing duodenoscope was passed from the mouth into the esophagus and further advanced from the esophagus into the stomach. From stomach scope was directed to the second portion of the duodenum.  Major papilla was aligned with the duodenoscope. The scope position was confirmed fluoroscopically. Previously placed biliary stent was seen and removed with mini-Snare.  Limited cholangiogram showed no obvious residual bile leak.  Pancreatogram was not obtained, intentionally. No pictures were obtained due to computer problems.      ADVERSE EVENT:     Suspected ciprofloxacin allergy (rash and tachycardia;  medication stopped before infusion completed).  IMPRESSIONS:     Removal of bile duct stent.  No obvious evidence of residual bile leak.  RECOMMENDATIONS:     1.  Watch for potential complications of procedure. 2.  Follow-up with [facility] on as-needed basis. REPEAT EXAM:   ___________________________________ Arta Silence, MD eSigned:  Arta Silence, MD 12/15/2013 12:25 PM   cc:

## 2013-12-15 NOTE — Discharge Instructions (Signed)
Endoscopic Retrograde Cholangiopancreatography (ERCP) °ERCP stands for endoscopic retrograde cholangiopancreatography. In this procedure, a thin, lighted tube (endoscope) is used. It is passed through the mouth and down the back of the throat into the upper part of the intestine, called the duodenum. A small, plastic tube (cannula) is then passed through the endoscope. It is directed into the bile duct or pancreatic duct. Dye is then injected through the tube. X-rays are taken to study the biliary and pancreatic passageways. This procedure is used to diagnosis many diseases of the pancreas, bile ducts, liver, and gallbladder. °LET YOUR CAREGIVER KNOW ABOUT:  °· Allergies to food or medicine.  °· Medicines taken, including vitamins, herbs, eyedrops, over-the-counter medicines, and creams.  °· Use of steroids (by mouth or creams).  °· Previous problems with anesthetics or numbing medicines.  °· History of bleeding problems or blood clots.  °· Previous surgery.  °· Other health problems, including diabetes and kidney problems.  °· Possibility of pregnancy, if this applies.  °· Any barium X-rays during the past week.  °BEFORE THE PROCEDURE  °· Do not eat or drink anything, including water, for at least 6 hours before the procedure.  °· Ask your caregiver whether you should stop taking certain medicines prior to your procedure.  °· Arrange for someone to drive you home. You will not be allowed to drive for several hours after the procedure.  °· Arrive at least 60 minutes before the procedure or as directed. This will give you time to check in and fill out any necessary paperwork.  °PROCEDURE  °· You will be given medicine through a vein (intravenously) to make you relaxed and sleepy.  °· You might have a breathing tube placed to give you medicine that makes you sleep (general anesthetic).  °· Your throat may be sprayed with medicine that numbs the area (local anesthetic).  °· You will lie on your left side. The endoscope  will be inserted through your mouth and into the duodenum. The tube will not interfere with your breathing. Gagging is prevented by the anesthesia.  °· While X-rays are being taken, you may be positioned on your stomach.  °During the ERCP, the person performing the procedure may identify a blockage or narrowed opening to the bile duct. A muscular portion of the main bile duct may be partially cut (sphincterotomy). A thin, plastic tube (stent) will be positioned inside your bile duct. This is to allow fluid secreted by the liver (bile) to flow more easily through the narrowed opening. °AFTER THE PROCEDURE  °· You will rest in bed until you are fully conscious.  °· When you first wake up, your throat may feel slightly sore.  °· You will not be allowed to eat or drink until numbness subsides.  °· Once you are able to drink, urinate, and sit on the edge of the bed without feeling sick to your stomach (nauseous) or dizzy, you may be allowed to go home.  °· Have a friend or family member with you for the first 24 hours after your procedure.  °SEEK MEDICAL CARE IF:  °· You have an oral temperature above 102° F (38.9° C).  °· You develop other signs of infection, including chills or feeling unwell.  °· You have abdominal pain.  °· You have questions or concerns.  °MAKE SURE YOU:  °· Understand these instructions.  °· Will watch your condition.  °· Will get help right away if you are not doing well or get worse.  °  Document Released: 10/23/2000 Document Revised: 10/10/2010 Document Reviewed: 05/16/2009 °ExitCare® Patient Information ©2012 [employer]. ° °

## 2013-12-16 ENCOUNTER — Encounter (HOSPITAL_COMMUNITY): Payer: Self-pay | Admitting: Gastroenterology

## 2013-12-22 ENCOUNTER — Telehealth: Payer: Self-pay | Admitting: *Deleted

## 2013-12-22 ENCOUNTER — Ambulatory Visit: Payer: No Typology Code available for payment source | Attending: General Surgery | Admitting: Physical Therapy

## 2013-12-22 DIAGNOSIS — R5381 Other malaise: Secondary | ICD-10-CM | POA: Insufficient documentation

## 2013-12-22 DIAGNOSIS — M549 Dorsalgia, unspecified: Secondary | ICD-10-CM | POA: Insufficient documentation

## 2013-12-22 DIAGNOSIS — M542 Cervicalgia: Secondary | ICD-10-CM | POA: Insufficient documentation

## 2013-12-22 DIAGNOSIS — Z5189 Encounter for other specified aftercare: Secondary | ICD-10-CM | POA: Insufficient documentation

## 2013-12-22 NOTE — Telephone Encounter (Signed)
Per Helene Kelp gave an appt for once a week for 4 weeks. appts made and printed....td

## 2013-12-22 NOTE — Patient Instructions (Signed)
Pt instructed to hold clasp hands together and reach forward, then from one hip, to forward, then to other hip to activate core.  She was also encouraged to walk every day

## 2013-12-22 NOTE — Therapy (Signed)
Physical Therapy Evaluation  Patient Details  Name: Deborah Trujillo MRN: [id number] Date of Birth: March 29, 1965  Encounter Date: 12/22/2013      PT End of Session - 12/22/13 1716    Visit Number 1   Number of Visits 4   PT Start Time 1150   PT Stop Time 1233   PT Time Calculation (min) 43 min      Past Medical History  Diagnosis Date  . Liver mass   . Hyperlipidemia   . Pain     PAIN - BURNING SENSATION RIGHT SIDE - BLOOD WORK ABNORMAL - FOUND TO HAVE LIVER MASS  . Colitis   . GERD (gastroesophageal reflux disease)     Past Surgical History  Procedure Laterality Date  . Neck mass excision  2009    absess removed   . Tubal ligation    . Laparoscopic partial hepatectomy Right 09/30/2013    Procedure: PARTIAL RIGHT HEPATECTOMY; CHOLECYSTECTOMY;  Surgeon: Deborah Klein, MD;  Location: [facility];  Service: General;  Laterality: Right;  . Ercp N/A 10/06/2013    Procedure: ENDOSCOPIC RETROGRADE CHOLANGIOPANCREATOGRAPHY (ERCP);  Surgeon: Deborah Silence, MD;  Location: [facility];  Service: Endoscopy;  Laterality: N/A;  . Ercp N/A 10/18/2013    Procedure: ENDOSCOPIC RETROGRADE CHOLANGIOPANCREATOGRAPHY (ERCP);  Surgeon: Deborah Columbia, MD;  Location: [facility];  Service: Endoscopy;  Laterality: N/A;  . Ercp N/A 12/15/2013    Procedure: ENDOSCOPIC RETROGRADE CHOLANGIOPANCREATOGRAPHY (ERCP);  Surgeon: Deborah Silence, MD;  Location: [facility];  Service: Endoscopy;  Laterality: N/A;  . Gastrointestinal stent removal N/A 12/15/2013    Procedure: GASTROINTESTINAL STENT REMOVAL;  Surgeon: Deborah Silence, MD;  Location: [facility];  Service: Endoscopy;  Laterality: N/A;    There were no vitals taken for this visit.  Visit Diagnosis:  Pain in the neck - Plan: PT plan of care cert/re-cert  Bilateral back pain, unspecified location - Plan: PT plan of care cert/re-cert  Physical deconditioning - Plan: PT plan of care cert/re-cert      Subjective Assessment - 12/22/13 1701    Symptoms Pt  has complaint so pain in back, nect, waist and right neck and shoulder.  she reports she wakes up with pain   Pertinent History Pt reports surgery on 09/30/13. She had a partial hepatectomy for hemangioma of right liver She had a bile leak with ERCP and a stent that had to be rivised. She continued to have difficulty and was found to have pleural effusion.  She had a thoracocentesis .  She has had a drain in abdomen and in the back that are now removed.   Patient Stated Goals to get stronger and have less pain   Currently in Pain? Yes   Pain Score 4    Pain Location Neck   Pain Orientation Right   Pain Descriptors / Indicators Other (Comment)  uncomfortable   Pain Onset More than a month ago   Pain Frequency Constant   Pain Relieving Factors movement   Multiple Pain Sites Yes   Pain Score 4   Pain Location Other (Comment)  uncomfortable   Pain Frequency Constant          [facility] PT Assessment - 12/22/13 1714    Assessment   Onset Date 09/11/13   Precautions   Precautions --  don't lift anything heavy for 3 months   [place] Private residence   Living Arrangements Children   Available Help at Discharge Family;Available PRN/intermittently   Type of Home  House   Home Access Stairs to enter   Entrance Stairs-Number of Steps 3   Entrance Stairs-Rails Right  currently going  up backwards or sideways   Home Layout One level   [place] - 2 wheels   Additional Comments not using the waslker   Prior Function   Level of Independence Independent with basic ADLs;Independent with gait   Vocation Unemployed   Leisure knitting   Cognition   Overall Cognitive Status Within Functional Limits for tasks assessed   Observation/Other Assessments   Observations Deborah Trujillo does not speak  English She had her neice  Deborah Trujillo as interpreter and then also, Deborah Trujillo came in   [place] well healed incision at anterior right abdomen and drain site  at anterior right abdomen and right back at bra strap level    Sensation   Light Touch Appears Intact   Coordination   Gross Motor Movements are Fluid and Coordinated No  movements are slowe and guarded   Bed Mobility   Rolling Right 4: Min assist   Rolling Left 5: Supervision   Right Sidelying to Sit 3: Mod assist   Supine to Sit 3: Mod assist   Transfers   Sit to Stand 7: Independent   Stand to Sit 7: Independent   6 Minute Walk- Baseline   6 Minute Walk- Baseline yes  857 feet with pain in left back and thigh            PT Education - 12/22/13 1722    Education provided Yes   Education Details as above   Deborah Trujillo) Educated Patient;Other (comment)  through Hulen Skains, interpreter   Methods Explanation;Demonstration   Comprehension Returned demonstration              Plan - 12/22/13 1717    Clinical Impression Statement Deborah Trujillo has had multiple medical procedures involving abdominal surgeries and drains and has had dereased activity levels as a result.  She presents with pain and deconditioning and should benefit from exercise and increased moblity   Pt will benefit from skilled therapeutic intervention in order to improve on the following deficits Pain;Decreased mobility;Decreased strength   Rehab Potential Excellent   PT Frequency 1x / week   PT Duration 4 weeks   PT Treatment/Interventions Therapeutic activities;Therapeutic exercise   PT Next Visit Plan beginning core and lower extremity strenghening.  review rolling and supine to sit and return        Problem List Patient Active Problem List   Diagnosis Date Noted  . Biloma 10/28/2013  . Bile leak, postoperative 10/17/2013  . Hemangioma of liver s/p partial hepatectomy 09/30/2013 09/30/2013  . Pancreas cyst 07/26/2013  . RUQ abdominal pain 07/21/2012  . Other malaise and fatigue 07/21/2012          Deborah Trujillo - 12/22/13 1714    Open a tight or new jar No difficulty   Do heavy  household chores (wash walls, wash floors) Severe difficulty   Carry a shopping bag or briefcase Severe difficulty   Wash your back No difficulty   Use a knife to cut food Moderate difficulty   Recreational activities in which you take some force or impact through your arm, shoulder, or hand (golf, hammering, tennis) Moderate difficulty   During the past week, to what extent has your arm, shoulder or hand problem interfered with your normal social activities with family, friends, neighbors, or groups? Slightly   During the past week, to what extent has your  arm, shoulder or hand problem limited your work or other regular daily activities Slightly   Arm, shoulder, or hand pain. Moderate   Tingling (pins and needles) in your arm, shoulder, or hand None   Difficulty Sleeping Mild difficulty   DASH Score 34.09 %            Long Term Clinic Goals - 12/22/13 1730    CC Long Term Goal  #1   Title Patient will report pain is decreased by 50 % so that she is better able to perfom her activities of daily living   Time 4   Period Weeks   Status New   CC Long Term Goal  #2   Title Patient will be able to perform a home exercise program   Time 4   Period Weeks   Status New   CC Long Term Goal  #3   Title Pt will be independent in rolling and supine to sit   Time 4   Period Weeks   Status New          Norwood Levo 12/22/2013, 5:39 PM

## 2013-12-30 ENCOUNTER — Ambulatory Visit: Payer: No Typology Code available for payment source

## 2013-12-30 DIAGNOSIS — M542 Cervicalgia: Secondary | ICD-10-CM

## 2013-12-30 DIAGNOSIS — M549 Dorsalgia, unspecified: Secondary | ICD-10-CM

## 2013-12-30 DIAGNOSIS — R5381 Other malaise: Secondary | ICD-10-CM

## 2013-12-30 NOTE — Therapy (Signed)
Physical Therapy Treatment  Patient Details  Name: Deborah Trujillo MRN: [id number] Date of Birth: [birth year]  Encounter Date: 12/30/2013      PT End of Session - 12/30/13 1442    Visit Number 2   Number of Visits 4   PT Start Time 8546   PT Stop Time 1436   PT Time Calculation (min) 44 min      Past Medical History  Diagnosis Date  . Liver mass   . Hyperlipidemia   . Pain     PAIN - BURNING SENSATION RIGHT SIDE - BLOOD WORK ABNORMAL - FOUND TO HAVE LIVER MASS  . Colitis   . GERD (gastroesophageal reflux disease)     Past Surgical History  Procedure Laterality Date  . Neck mass excision  2009    absess removed   . Tubal ligation    . Laparoscopic partial hepatectomy Right 09/30/2013    Procedure: PARTIAL RIGHT HEPATECTOMY; CHOLECYSTECTOMY;  Surgeon: Stark Klein, MD;  Location: [facility];  Service: General;  Laterality: Right;  . Ercp N/A 10/06/2013    Procedure: ENDOSCOPIC RETROGRADE CHOLANGIOPANCREATOGRAPHY (ERCP);  Surgeon: Arta Silence, MD;  Location: [facility];  Service: Endoscopy;  Laterality: N/A;  . Ercp N/A 10/18/2013    Procedure: ENDOSCOPIC RETROGRADE CHOLANGIOPANCREATOGRAPHY (ERCP);  Surgeon: Jeryl Columbia, MD;  Location: [facility];  Service: Endoscopy;  Laterality: N/A;  . Ercp N/A 12/15/2013    Procedure: ENDOSCOPIC RETROGRADE CHOLANGIOPANCREATOGRAPHY (ERCP);  Surgeon: Arta Silence, MD;  Location: [facility];  Service: Endoscopy;  Laterality: N/A;  . Gastrointestinal stent removal N/A 12/15/2013    Procedure: GASTROINTESTINAL STENT REMOVAL;  Surgeon: Arta Silence, MD;  Location: [facility];  Service: Endoscopy;  Laterality: N/A;    There were no vitals taken for this visit.  Visit Diagnosis:  Pain in the neck  Bilateral back pain, unspecified location  Physical deconditioning      Subjective Assessment - 12/30/13 1444    Symptoms The back pain i was having last week is much better today. Took pain meds a little while ago.   Currently in  Pain? Yes   Pain Score 4    Pain Location Abdomen   Pain Orientation Right   Pain Descriptors / Indicators Discomfort   Pain Type Surgical pain   Pain Frequency Intermittent            [facility] Adult PT Treatment/Exercise - 12/30/13 0001    Lumbar Exercises: Stretches   Lower Trunk Rotation 3 reps;10 seconds   Hip Flexor Stretch 3 reps;10 seconds;60 seconds  each leg, holding behind knee   Pelvic Tilt --  10 reps   Piriformis Stretch 2 reps;10 seconds   Piriformis Stretch Limitations uncomfortable for patient in abdomen so stopped   Lumbar Exercises: Supine   Clam 10 reps  with pelvic tilt   Heel Slides 5 reps  with pelvic tilt   Heel Slides Limitations Pain with Rt LE so stopped   Bent Knee Raise 10 reps  with pelvic tilt, each leg   Bridge 10 reps   Straight Leg Raise 10 reps  each leg   Other Supine Lumbar Exercises --   Knee/Hip Exercises: Standing   Other Standing Knee Exercises Standing for 3 way bil hip raises, 2# on each ankle into hip flexion, abduction, and extension  Demo and verbal cuing for technique   Knee/Hip Exercises: Seated   Long Arc Quad 10 reps  5 sec    Long Arc Quad Weight 2 lbs.  PT Education - 12/30/13 1439    Education provided Yes   Education Details Hip and core strength/flexibility   Person(s) Educated Patient;Other (comment)  Interpreter   Methods Explanation;Demonstration;Verbal cues;Handout   Comprehension Verbalized understanding;Returned demonstration;Verbal cues required              Plan - 12/30/13 1443    Clinical Impression Statement Patient able to tolerate all exercises today with minimal discomfort/increase in pain. Able to begin home exercise program and patient able to return good demo on all.   Pt will benefit from skilled therapeutic intervention in order to improve on the following deficits Pain;Decreased mobility;Decreased strength   PT Next Visit Plan Review and progress home exercise program prn;  core and lower extremity strenghening.  review rolling and supine to sit and return        Problem List Patient Active Problem List   Diagnosis Date Noted  . Biloma 10/28/2013  . Bile leak, postoperative 10/17/2013  . Hemangioma of liver s/p partial hepatectomy 09/30/2013 09/30/2013  . Pancreas cyst 07/26/2013  . RUQ abdominal pain 07/21/2012  . Other malaise and fatigue 07/21/2012                                            Long Term Clinic Goals - 12/30/13 1446    CC Long Term Goal  #2   Title Patient will be able to perform a home exercise program   Time 4   Period Weeks   Status On-going   CC Long Term Goal  #3   Title Pt will be independent in rolling and supine to sit   Time 4   Period Weeks   Status On-going          Otelia Limes, PTA 12/30/2013, 2:50 PM

## 2013-12-30 NOTE — Patient Instructions (Addendum)
(  Home) Knee: Extension - Sitting   Punto de enganche atrs, sentado con rodilla descolgada sobre el borde de una silla. Eleve la pierna, extendiendo la rodilla. Repita _10-15___ veces por rutina. Realice _9___ [place] por sesin.  Use pesas de __1-2__ libras.  (Home) CaudaFlexion and Extension - Standing   Stand and support self while swinging uninvolved leg and hip forward and backward _10-15__ times. Repeat with involved leg and hip. Do _1-2__ times per day. Can use 1-2 #.  (Home) Balance: With Hip Abduction - Unilateral Stance (Resist)   Lado opuesto hacia el punto de [place], correa alrededor del tobillo izquierdo, pierna [place]. Balancee la pierna a travs del cuerpo al lado y afuera. Utilice apoyo de brazos si le es necesario para mantener el equilibrio. Repita __10-15__ veces por rutina. Realice _8-9___ Albertina [place] por sesin. Use pesas de __1-2__ libras.  Copyright  VHI. All rights reserved.    [facility](Clinic) Flexion: Pelvic Tilt   Acostado con cuello apoyado, rodillas flexionadas, pies apoyados. Contraiga y lleve el estomago hacia adentro, empujando la espalda hacia abajo en contra de la superficie de apoyo. No empuje hacia abajo con las piernas. Repita __10__ veces por rutina. Realice _2-1___ [place] por sesin.  Aguantolo 5 seconds.  Copyright  VHI. All rights reserved.  Knee Roll   Acostado boca arriba, con rodillas dobladas y pies apoyados en el piso, brazos extendidos a los lados, lentamente balancee ambas rodillas hacia un lado, sostenga 5 segundos. Regrese a la posicin inicial, sostenga 5 segundos. Luego balancee hacia el lado opuesto, sostenga 5 segundos. Regrese a la posicin inicial. Mantenga los hombros y Teacher, music en contacto con el piso.   Copyright  VHI. All rights reserved.

## 2014-01-05 ENCOUNTER — Ambulatory Visit: Payer: No Typology Code available for payment source | Admitting: Physical Therapy

## 2014-01-05 DIAGNOSIS — M549 Dorsalgia, unspecified: Secondary | ICD-10-CM

## 2014-01-05 DIAGNOSIS — M542 Cervicalgia: Secondary | ICD-10-CM

## 2014-01-05 DIAGNOSIS — R5381 Other malaise: Secondary | ICD-10-CM

## 2014-01-05 NOTE — Therapy (Signed)
Physical Therapy Treatment  Patient Details  Name: Deborah Trujillo MRN: [id number] Date of Birth: 01-08-1966  Encounter Date: 01/05/2014      PT End of Session - 01/05/14 1506    Visit Number 3   Number of Visits 4   Date for PT Re-Evaluation 01/12/14   PT Start Time 0370   PT Stop Time 1435   PT Time Calculation (min) 47 min   Behavior During Therapy [facility] for tasks assessed/performed      Past Medical History  Diagnosis Date  . Liver mass   . Hyperlipidemia   . Pain     PAIN - BURNING SENSATION RIGHT SIDE - BLOOD WORK ABNORMAL - FOUND TO HAVE LIVER MASS  . Colitis   . GERD (gastroesophageal reflux disease)     Past Surgical History  Procedure Laterality Date  . Neck mass excision  2009    absess removed   . Tubal ligation    . Laparoscopic partial hepatectomy Right 09/30/2013    Procedure: PARTIAL RIGHT HEPATECTOMY; CHOLECYSTECTOMY;  Surgeon: Stark Klein, MD;  Location: [facility];  Service: General;  Laterality: Right;  . Ercp N/A 10/06/2013    Procedure: ENDOSCOPIC RETROGRADE CHOLANGIOPANCREATOGRAPHY (ERCP);  Surgeon: Arta Silence, MD;  Location: [facility];  Service: Endoscopy;  Laterality: N/A;  . Ercp N/A 10/18/2013    Procedure: ENDOSCOPIC RETROGRADE CHOLANGIOPANCREATOGRAPHY (ERCP);  Surgeon: Jeryl Columbia, MD;  [facility];  Service: Endoscopy;  Laterality: N/A;  . Ercp N/A 12/15/2013    Procedure: ENDOSCOPIC RETROGRADE CHOLANGIOPANCREATOGRAPHY (ERCP);  Surgeon: Arta Silence, MD;  Location: [facility];  Service: Endoscopy;  Laterality: N/A;  . Gastrointestinal stent removal N/A 12/15/2013    Procedure: GASTROINTESTINAL STENT REMOVAL;  Surgeon: Arta Silence, MD;  [facility];  Service: Endoscopy;  Laterality: N/A;    There were no vitals taken for this visit.  Visit Diagnosis:  Pain in the neck  Bilateral back pain, unspecified location  Physical deconditioning      Subjective Assessment - 01/05/14 1454    Symptoms pt reports she  has been having right shoulder pain even at night since she has been doing more cooking and housework   Currently in Pain? Yes   Pain Score 4    Pain Location Shoulder   Pain Orientation Right   Pain Descriptors / Indicators Aching   Pain Onset 1 to 4 weeks ago   Pain Frequency Intermittent   Pain Relieving Factors heating pad, medicine            [facility] Adult PT Treatment/Exercise - 01/05/14 1457    Bed Mobility   Rolling Right 6: Modified independent (Device/Increase time)   Rolling Left 6: Modified independent (Device/Increase time)   Right Sidelying to Sit 6: Modified independent (Device/Increase time)   Supine to Sit 6: Modified independent (Device/Increase time)   Exercises   Exercises Shoulder;Lumbar   Lumbar Exercises: Stretches   Single Knee to Chest Stretch 5 reps  both legs   Lower Trunk Rotation 10 seconds   Lumbar Exercises: Aerobic   Stationary Bike nustep x 5 minutes  pt moves very slowly   Lumbar Exercises: Supine   Ab Set 5 reps   Clam 5 reps   Bridge 10 reps   Lumbar Exercises: Sidelying   Hip Abduction 10 reps   Shoulder Exercises: Supine   Other Supine Exercises supine cane exercises   Other Supine Exercises bicep curls 3#    Shoulder Exercises: Seated   Retraction 10 reps   Other  Seated Exercises scaption to sets of 10 with no weight   Shoulder Exercises: Standing   External Rotation 10 reps;Strengthening;Theraband   Theraband Level (Shoulder External Rotation) Level 2 (Red)   Internal Rotation 10 reps;Theraband;Strengthening   Theraband Level (Shoulder Internal Rotation) Level 2 (Red)   Flexion Strengthening;10 reps;AROM;Theraband   Theraband Level (Shoulder Flexion) Level 2 (Red)   Extension Strengthening;5 reps;Theraband   Theraband Level (Shoulder Extension) Level 2 (Red)   Moist Heat Therapy   Number Minutes Moist Heat 10 Minutes   Moist Heat Location Shoulder  right          PT Education - 01/05/14 1505    Education provided Yes    Education Details shoulder range of motion and strengthening   Person(s) Educated Patient  interpreter,Marlena, present for session   Methods Explanation;Demonstration;Verbal cues;Tactile cues;Handout   Comprehension Verbalized understanding;Returned demonstration              Plan - 01/05/14 1511    PT Next Visit Plan Review and progress home exercise program prn; focus on right shoulder range of motion    Consulted and Agree with Plan of Care Patient        Problem List Patient Active Problem List   Diagnosis Date Noted  . Biloma 10/28/2013  . Bile leak, postoperative 10/17/2013  . Hemangioma of liver s/p partial hepatectomy 09/30/2013 09/30/2013  . Pancreas cyst 07/26/2013  . RUQ abdominal pain 07/21/2012  . Other malaise and fatigue 07/21/2012          Long Term Clinic Goals - 01/05/14 1510    CC Long Term Goal  #3   Status Partially Met          Norwood Levo 01/05/2014, 3:12 PM

## 2014-01-05 NOTE — Patient Instructions (Addendum)
  Cane Overhead - Supine   Hold cane at thighs with both hands, extend arms straight over head. Hold ___ seconds. Repeat ___ times. Do ___ times per day.  Copyright  VHI. All rights reserved.    External Rotation (Resistive Band)   Doble el codo en ngulo recto y mantngalo firmemente contra su cintura. Fije la banda con el brazo opuesto y Museum/gallery curator. Mantenga ____ segundos. Repita ____ veces. Haga ____ sesiones por da.  Copyright  VHI. All rights reserved.

## 2014-01-12 ENCOUNTER — Ambulatory Visit: Payer: No Typology Code available for payment source | Attending: General Surgery

## 2014-01-12 DIAGNOSIS — R5381 Other malaise: Secondary | ICD-10-CM | POA: Insufficient documentation

## 2014-01-12 DIAGNOSIS — M542 Cervicalgia: Secondary | ICD-10-CM | POA: Insufficient documentation

## 2014-01-12 DIAGNOSIS — Z5189 Encounter for other specified aftercare: Secondary | ICD-10-CM | POA: Insufficient documentation

## 2014-01-12 DIAGNOSIS — M549 Dorsalgia, unspecified: Secondary | ICD-10-CM | POA: Insufficient documentation

## 2014-01-12 NOTE — Therapy (Signed)
[place], [place], [address] Phone: [phone]   Fax:  [phone]  Physical Therapy Treatment  Patient Details  Name: Deborah Trujillo MRN: [id number] Date of Birth: 1965/10/07  Encounter Date: 01/12/2014      PT End of Session - 01/12/14 1434    Visit Number 4   Number of Visits 4   Date for PT Re-Evaluation 01/12/14   PT Start Time 6962   PT Stop Time 1432   PT Time Calculation (min) 40 min      Past Medical History  Diagnosis Date  . Liver mass   . Hyperlipidemia   . Pain     PAIN - BURNING SENSATION RIGHT SIDE - BLOOD WORK ABNORMAL - FOUND TO HAVE LIVER MASS  . Colitis   . GERD (gastroesophageal reflux disease)     Past Surgical History  Procedure Laterality Date  . Neck mass excision  2009    absess removed   . Tubal ligation    . Laparoscopic partial hepatectomy Right 09/30/2013    Procedure: PARTIAL RIGHT HEPATECTOMY; CHOLECYSTECTOMY;  Surgeon: Stark Klein, MD;  [facility];  Service: General;  Laterality: Right;  . Ercp N/A 10/06/2013    Procedure: ENDOSCOPIC RETROGRADE CHOLANGIOPANCREATOGRAPHY (ERCP);  Surgeon: Arta Silence, MD;  Location: [facility];  Service: Endoscopy;  Laterality: N/A;  . Ercp N/A 10/18/2013    Procedure: ENDOSCOPIC RETROGRADE CHOLANGIOPANCREATOGRAPHY (ERCP);  Surgeon: Jeryl Columbia, MD;  [facility];  Service: Endoscopy;  Laterality: N/A;  . Ercp N/A 12/15/2013    Procedure: ENDOSCOPIC RETROGRADE CHOLANGIOPANCREATOGRAPHY (ERCP);  Surgeon: Arta Silence, MD;  Location: [facility];  Service: Endoscopy;  Laterality: N/A;  . Gastrointestinal stent removal N/A 12/15/2013    Procedure: GASTROINTESTINAL STENT REMOVAL;  Surgeon: Arta Silence, MD;  [facility];  Service: Endoscopy;  Laterality: N/A;    There were no vitals taken for this visit.  Visit Diagnosis:  Pain in the neck  Bilateral back pain, unspecified location  Physical  deconditioning      Subjective Assessment - 01/12/14 1401    Symptoms Rt shoulder is only thing still bothering me, but it feels better than it did last time.   Currently in Pain? Yes   Pain Score 5    Pain Orientation Right   Pain Descriptors / Indicators Aching  Feel like its from the weather   Pain Relieving Factors heating pad, pain medicine          [facility] PT Assessment - 01/12/14 0001    AROM   Right Shoulder Flexion 151 Degrees   Right Shoulder ABduction 160 Degrees          [facility] Adult PT Treatment/Exercise - 01/12/14 0001    Lumbar Exercises: Stretches   Single Knee to Chest Stretch 5 reps   Single Knee to Chest Stretch Limitations needed towel   Double Knee to Chest Stretch 5 reps   Lower Trunk Rotation 5 reps;10 seconds   Pelvic Tilt --  10 reps, 5 sec   Lumbar Exercises: Aerobic   Stationary Bike nustep x 5 minutes level 2  Patient able to do better pace today.   Shoulder Exercises: Standing   Flexion AROM;10 reps;Both   Shoulder Flexion Weight (lbs) 1   ABduction 10 reps;AROM;Both   Shoulder ABduction Weight (lbs) 1   Other Standing Exercises Scaption bil UEs 1 lb, 10 reps   Shoulder Exercises: Pulleys   Flexion 2 minutes   Flexion Limitations some pain with end  range stretching   ABduction 2 minutes   ABduction Limitations pt reports pain gets less as she continues stretching   Shoulder Exercises: Therapy Ball   Flexion 10 reps  Roll yellow ball up wall   Shoulder Exercises: ROM/Strengthening   Wall Pushups 10 reps   Other ROM/Strengthening Exercises Standing with back against wall for bil UE abduction 5 reps          PT Education - 01/12/14 1433    Education provided Yes   Education Details Suggested pt try [employer] HEP 1x/day for now to see if this decreases Rt shoulder pain after. also instructed to use proper posture with body mechanics.   Person(s) Educated Patient;Other (comment)  Interpreter   Methods Explanation   Comprehension  Verbalized understanding              Plan - 01/12/14 1434    Clinical Impression Statement Tolerated exercises better today with less complaints of pain. Patient has been experienceing some pain at mid back with increasing ADLs but not having proper posture.   Pt will benefit from skilled therapeutic intervention in order to improve on the following deficits Pain;Decreased mobility;Decreased strength   Rehab Potential Excellent   PT Frequency 1x / week   PT Duration 4 weeks   PT Treatment/Interventions Therapeutic activities;Therapeutic exercise   PT Next Visit Plan Review and progress home exercise program prn; focus on right shoulder range of motion                                Problem List Patient Active Problem List   Diagnosis Date Noted  . Biloma 10/28/2013  . Bile leak, postoperative 10/17/2013  . Hemangioma of liver s/p partial hepatectomy 09/30/2013 09/30/2013  . Pancreas cyst 07/26/2013  . RUQ abdominal pain 07/21/2012  . Other malaise and fatigue 07/21/2012    Collie Siad Ann,PTA 01/12/2014, 2:50 PM

## 2014-01-19 ENCOUNTER — Ambulatory Visit: Payer: No Typology Code available for payment source

## 2014-01-19 DIAGNOSIS — M542 Cervicalgia: Secondary | ICD-10-CM

## 2014-01-19 DIAGNOSIS — R5381 Other malaise: Secondary | ICD-10-CM

## 2014-01-19 DIAGNOSIS — M549 Dorsalgia, unspecified: Secondary | ICD-10-CM

## 2014-01-19 NOTE — Patient Instructions (Signed)
  Copyright  VHI. All rights reserved.  Strengthening: Resisted Flexion   Hold tubing with left arm at side. Pull forward and up. Move shoulder through pain-free range of motion. Repeat _10___ times per set. Do __1__ sets per session. Do 1-2____ sessions per day.  http://orth.exer.us/824   Copyright  VHI. All rights reserved.  Strengthening: Resisted External Rotation   Hold tubing in right hand, elbow at side and forearm across body. Rotate forearm out. Repeat ____ times per set. Do ____ sets per session. Do ____ sessions per day.  http://orth.exer.us/828   Copyright  VHI. All rights reserved.  Strengthening: Resisted Internal Rotation   Hold tubing in left hand, elbow at side and forearm out. Rotate forearm in across body. Repeat __10__ times per set. Do __1__ sets per session. Do _1-2___ sessions per day.  http://orth.exer.us/830   Copyright  VHI. All rights reserved.  Strengthening: Resisted Extension   Hold tubing in right hand, arm forward. Pull arm back, elbow straight. Repeat _10___ times per set. Do __1__ sets per session. Do _1-2___ sessions per day.  http://orth.exer.us/832   Copyright  VHI. All rights reserved.  Strengthening: Resisted Horizontal Abduction   Hold tubing in right hand, elbow straight, arm in, parallel to floor. Pull arm out from side through pain-free range. Repeat _10___ times per set. Do _1___ sets per session. Do 1-2____ sessions per day.  http://orth.exer.us/836   Copyright  VHI. All rights reserved.

## 2014-01-19 NOTE — Therapy (Signed)
[place], [place], [address] Phone: [phone]   Fax:  [phone]  Physical Therapy Treatment  Patient Details  Name: Deborah Trujillo MRN: [id number] Date of Birth: 14-Aug-1965  Encounter Date: 01/19/2014      PT End of Session - 01/19/14 1438    Visit Number 5   PT Start Time 1350   PT Stop Time 1017   PT Time Calculation (min) 47 min      Past Medical History  Diagnosis Date  . Liver mass   . Hyperlipidemia   . Pain     PAIN - BURNING SENSATION RIGHT SIDE - BLOOD WORK ABNORMAL - FOUND TO HAVE LIVER MASS  . Colitis   . GERD (gastroesophageal reflux disease)     Past Surgical History  Procedure Laterality Date  . Neck mass excision  2009    absess removed   . Tubal ligation    . Laparoscopic partial hepatectomy Right 09/30/2013    Procedure: PARTIAL RIGHT HEPATECTOMY; CHOLECYSTECTOMY;  Surgeon: Stark Klein, MD;  Location: [facility];  Service: General;  Laterality: Right;  . Ercp N/A 10/06/2013    Procedure: ENDOSCOPIC RETROGRADE CHOLANGIOPANCREATOGRAPHY (ERCP);  Surgeon: Arta Silence, MD;  Location: [facility];  Service: Endoscopy;  Laterality: N/A;  . Ercp N/A 10/18/2013    Procedure: ENDOSCOPIC RETROGRADE CHOLANGIOPANCREATOGRAPHY (ERCP);  Surgeon: Jeryl Columbia, MD;  [facility]: [facility];  Service: Endoscopy;  Laterality: N/A;  . Ercp N/A 12/15/2013    Procedure: ENDOSCOPIC RETROGRADE CHOLANGIOPANCREATOGRAPHY (ERCP);  Surgeon: Arta Silence, MD;  Location: [facility];  Service: Endoscopy;  Laterality: N/A;  . Gastrointestinal stent removal N/A 12/15/2013    Procedure: GASTROINTESTINAL STENT REMOVAL;  Surgeon: Arta Silence, MD;  [facility];  Service: Endoscopy;  Laterality: N/A;    There were no vitals taken for this visit.  Visit Diagnosis:  No diagnosis found.      Subjective Assessment - 01/19/14 1353    Symptoms My back is much better, my Rt shoulder still hurts. Would like to  continue to work on my Rt shoulder.   Patient Stated Goals Want my Rt shoulder to get stronger and stop hurting when I reach overhead.   Currently in Pain? Yes   Pain Score 5    Pain Location Shoulder   Pain Orientation Right   Pain Descriptors / Indicators Shooting  Only with OH reaching   Pain Type Surgical pain   Pain Onset 1 to 4 weeks ago          [facility] PT Assessment - 01/19/14 0001    AROM   Right Shoulder Flexion 145 Degrees   Right Shoulder ABduction 156 Degrees          [facility] Adult PT Treatment/Exercise - 01/19/14 0001    Lumbar Exercises: Aerobic   Stationary Bike Nustep level 2 x6 mins   Shoulder Exercises: Standing   Horizontal ABduction AROM;Strengthening;Both;10 reps   Theraband Level (Shoulder Horizontal ABduction) Level 1 (Yellow)   External Rotation AROM;Strengthening;Right;10 reps   Theraband Level (Shoulder External Rotation) Level 1 (Yellow)   Internal Rotation AROM;Strengthening;Right;10 reps   Theraband Level (Shoulder Internal Rotation) Level 1 (Yellow)   Flexion AROM;Strengthening;Right;10 reps   Theraband Level (Shoulder Flexion) Level 1 (Yellow)   Extension AROM;Strengthening;Right;10 reps   Theraband Level (Shoulder Extension) Level 1 (Yellow)   Shoulder Exercises: Pulleys   Flexion 2 minutes   Flexion Limitations Pain not as intense with AAROM   ABduction 2 minutes   Manual Therapy  Passive ROM In supine to Rt shoulder into flexion, abduction, and D2 to pts tolerance   Shoulder Exercises: Therapy Ball   Flexion 10 reps  Roll yellow ball up wall          PT Education - 01/19/14 1437    Education provided Yes   Education Details Reissued [place] exercises with yellow theraband   Person(s) Educated Patient  Interpreter   Methods Explanation;Demonstration;Tactile cues;Verbal cues   Comprehension Verbalized understanding;Returned demonstration;Tactile cues required                                      Problem List Patient Active Problem List   Diagnosis Date Noted  . Biloma 10/28/2013  . Bile leak, postoperative 10/17/2013  . Hemangioma of liver s/p partial hepatectomy 09/30/2013 09/30/2013  . Pancreas cyst 07/26/2013  . RUQ abdominal pain 07/21/2012  . Other malaise and fatigue 07/21/2012    Otelia Limes, PTA 01/19/2014, 4:37 PM

## 2014-02-07 ENCOUNTER — Ambulatory Visit: Payer: No Typology Code available for payment source | Admitting: Physical Therapy

## 2014-02-07 DIAGNOSIS — M549 Dorsalgia, unspecified: Secondary | ICD-10-CM

## 2014-02-07 DIAGNOSIS — M542 Cervicalgia: Secondary | ICD-10-CM

## 2014-02-07 DIAGNOSIS — R5381 Other malaise: Secondary | ICD-10-CM

## 2014-02-07 NOTE — Therapy (Signed)
[place], [place], [address] Phone: [phone]   Fax:  [phone]  Physical Therapy Treatment  Patient Details  Name: Deborah Trujillo MRN: [id number] Date of Birth: 11-01-1965  Encounter Date: 02/07/2014      PT End of Session - 02/07/14 1013    Visit Number 6   Number of Visits 7   Date for PT Re-Evaluation 02/25/14   PT Start Time 0933   PT Stop Time 1015   PT Time Calculation (min) 42 min   Activity Tolerance Patient limited by pain  for right shoulder PROM   Behavior During Therapy [facility] for tasks assessed/performed      Past Medical History  Diagnosis Date  . Liver mass   . Hyperlipidemia   . Pain     PAIN - BURNING SENSATION RIGHT SIDE - BLOOD WORK ABNORMAL - FOUND TO HAVE LIVER MASS  . Colitis   . GERD (gastroesophageal reflux disease)     Past Surgical History  Procedure Laterality Date  . Neck mass excision  2009    absess removed   . Tubal ligation    . Laparoscopic partial hepatectomy Right 09/30/2013    Procedure: PARTIAL RIGHT HEPATECTOMY; CHOLECYSTECTOMY;  Surgeon: Stark Klein, MD;  Location: [facility];  Service: General;  Laterality: Right;  . Ercp N/A 10/06/2013    Procedure: ENDOSCOPIC RETROGRADE CHOLANGIOPANCREATOGRAPHY (ERCP);  Surgeon: Arta Silence, MD;  Location: [facility];  Service: Endoscopy;  Laterality: N/A;  . Ercp N/A 10/18/2013    Procedure: ENDOSCOPIC RETROGRADE CHOLANGIOPANCREATOGRAPHY (ERCP);  Surgeon: Jeryl Columbia, MD;  [facility];  Service: Endoscopy;  Laterality: N/A;  . Ercp N/A 12/15/2013    Procedure: ENDOSCOPIC RETROGRADE CHOLANGIOPANCREATOGRAPHY (ERCP);  Surgeon: Arta Silence, MD;  Location: [facility];  Service: Endoscopy;  Laterality: N/A;  . Gastrointestinal stent removal N/A 12/15/2013    Procedure: GASTROINTESTINAL STENT REMOVAL;  Surgeon: Arta Silence, MD;  [facility];  Service: Endoscopy;  Laterality: N/A;     There were no vitals taken for this visit.  Visit Diagnosis:  Pain in the neck - Plan: PT plan of care cert/re-cert  Bilateral back pain, unspecified location - Plan: PT plan of care cert/re-cert  Physical deconditioning - Plan: PT plan of care cert/re-cert      Subjective Assessment - 02/07/14 0934    Symptoms Nothing new today.  No problems or questions on Theraband home exercise program.   Currently in Pain? Yes   Pain Score 1    Pain Location Shoulder   Pain Orientation Right   Pain Relieving Factors exercise          [facility] PT Assessment - 02/07/14 0001    AROM   Right Shoulder Flexion 150 Degrees   Right Shoulder ABduction 180 Degrees                  [facility] Adult PT Treatment/Exercise - 02/07/14 0001    Lumbar Exercises: Aerobic   Stationary Bike Nustep level 3, then 4 x 7 minutes with right arm resting for 2 minutes midway   Shoulder Exercises: Seated   Other Seated Exercises backward shoulder rolls   Shoulder Exercises: Pulleys   Flexion 2 minutes   ABduction 2 minutes   Shoulder Exercises: Therapy Ball   Flexion 10 reps   ABduction 10 reps   Shoulder Exercises: ROM/Strengthening   "W" Arms x5   Manual Therapy   Passive ROM  In supine, right shoulder PROM with gentle stretch  to pt. tolerance in ER, abduction,and flexion of right shoulder; also D2 pattern.                        [place] [facility] Goals - 02/07/14 1012    CC Long Term Goal  #3   Status Achieved            Plan - 02/07/14 1014    Clinical Impression Statement Pt. shows improved ROM and reports doing better; she feels ready for discharge but would like more home exercises for her right shoulder.   Pt will benefit from skilled therapeutic intervention in order to improve on the following deficits Decreased range of motion   Rehab Potential Excellent   PT Frequency --  one more visit   PT Duration Other (comment)  one more visit   PT  Treatment/Interventions Therapeutic exercise;Patient/family education;Passive range of motion   PT Next Visit Plan Add to current HEP with right shoulder ROM and relaxation exercises; also posture re-ed; discuss walking or other exercise; continue PROM   PT Home Exercise Plan see plan above        Problem List Patient Active Problem List   Diagnosis Date Noted  . Biloma 10/28/2013  . Bile leak, postoperative 10/17/2013  . Hemangioma of liver s/p partial hepatectomy 09/30/2013 09/30/2013  . Pancreas cyst 07/26/2013  . RUQ abdominal pain 07/21/2012  . Other malaise and fatigue 07/21/2012    [name] 02/07/2014, 10:27 AM  [place], [place], [address] Phone: [phone]   Fax:  [phone]  [name], [place]

## 2014-02-09 ENCOUNTER — Ambulatory Visit: Payer: No Typology Code available for payment source | Admitting: Physical Therapy

## 2014-02-09 DIAGNOSIS — M549 Dorsalgia, unspecified: Secondary | ICD-10-CM

## 2014-02-09 DIAGNOSIS — R5381 Other malaise: Secondary | ICD-10-CM

## 2014-02-09 NOTE — Therapy (Signed)
[place], [place], [address] Phone: [phone]   Fax:  [phone]  Physical Therapy Treatment  Patient Details  Name: Deborah Trujillo MRN: [id number] Date of Birth: February 28, 1965  Encounter Date: 02/09/2014      PT End of Session - 02/09/14 1421    Visit Number 7   Number of Visits 8   Date for PT Re-Evaluation 02/25/14   PT Start Time 2297   PT Stop Time 1420   PT Time Calculation (min) 35 min   Equipment Utilized During Treatment Other (comment)  dowel   Activity Tolerance Patient tolerated treatment well   Behavior During Therapy [facility] for tasks assessed/performed      Past Medical History  Diagnosis Date  . Liver mass   . Hyperlipidemia   . Pain     PAIN - BURNING SENSATION RIGHT SIDE - BLOOD WORK ABNORMAL - FOUND TO HAVE LIVER MASS  . Colitis   . GERD (gastroesophageal reflux disease)     Past Surgical History  Procedure Laterality Date  . Neck mass excision  2009    absess removed   . Tubal ligation    . Laparoscopic partial hepatectomy Right 09/30/2013    Procedure: PARTIAL RIGHT HEPATECTOMY; CHOLECYSTECTOMY;  Surgeon: Stark Klein, MD;  Location: [facility];  Service: General;  Laterality: Right;  . Ercp N/A 10/06/2013    Procedure: ENDOSCOPIC RETROGRADE CHOLANGIOPANCREATOGRAPHY (ERCP);  Surgeon: Arta Silence, MD;  Location: [facility];  Service: Endoscopy;  Laterality: N/A;  . Ercp N/A 10/18/2013    Procedure: ENDOSCOPIC RETROGRADE CHOLANGIOPANCREATOGRAPHY (ERCP);  Surgeon: Jeryl Columbia, MD;  Location: [facility];  Service: Endoscopy;  Laterality: N/A;  . Ercp N/A 12/15/2013    Procedure: ENDOSCOPIC RETROGRADE CHOLANGIOPANCREATOGRAPHY (ERCP);  Surgeon: Arta Silence, MD;  Location: [facility];  Service: Endoscopy;  Laterality: N/A;  . Gastrointestinal stent removal N/A 12/15/2013    Procedure: GASTROINTESTINAL STENT REMOVAL;  Surgeon: Arta Silence, MD;  Location: [facility];   Service: Endoscopy;  Laterality: N/A;    There were no vitals taken for this visit.  Visit Diagnosis:  Physical deconditioning  Bilateral back pain, unspecified location      Subjective Assessment - 02/09/14 1350    Symptoms Nothing new today.  Felt fine after our last visit.   Currently in Pain? Yes   Pain Score 1    Pain Location Shoulder   Pain Orientation Right   Aggravating Factors  therapy exercise   Pain Relieving Factors rest                    [facility] Adult PT Treatment/Exercise - 02/09/14 0001    Lumbar Exercises: Aerobic   Stationary Bike Nustep level 5 x 8 mins.                PT Education - 02/09/14 1420    Education provided Yes                [place] - 02/09/14 1424    CC Long Term Goal  #2   Status Achieved            Plan - 02/09/14 1421    Clinical Impression Statement Pt. did very well and was receptive to new HEP today.  She performed all of exercises that are on the patient instruction sheet 5 times each for right arm.   Pt will benefit from skilled therapeutic intervention in order to improve on the following deficits  Decreased range of motion;Decreased endurance;Decreased strength   Rehab Potential Excellent   PT Frequency Other (comment)  one more visit   PT Duration Other (comment)  on 02/14/14   PT Treatment/Interventions Patient/family education;Therapeutic exercise   PT Next Visit Plan Review HEP given today for UE and trunk stretching; consider adding LE stretches and basic strengthening; discharge next time.   PT Home Exercise Plan See patient instructions.   Consulted and Agree with Plan of Care Patient  interpreter assisted        Problem List Patient Active Problem List   Diagnosis Date Noted  . Biloma 10/28/2013  . Bile leak, postoperative 10/17/2013  . Hemangioma of liver s/p partial hepatectomy 09/30/2013 09/30/2013  . Pancreas cyst 07/26/2013  . RUQ abdominal pain 07/21/2012   . Other malaise and fatigue 07/21/2012    SALISBURY,DONNA 02/09/2014, 2:25 PM  [place], [place], [address] Phone: [phone]   Fax:  [phone]  [name], [place]

## 2014-02-09 NOTE — Patient Instructions (Addendum)
Flexors Stick Stretch I   Stand or sit, dowel in palm of arm to be stretched. Other arm, holding dowel at side and behind body, pushes arm being stretched forward and upward until straight over head. Hold _5__ seconds. Repeat _5__ times per session. Do _2__ sessions per day.  Copyright  VHI. All rights reserved.  Inferior Capsule Stick Stretch I   Stand or sit, dowel in palm of arm to be stretched. Other arm, holding dowel in front of body, pushes outward and upward until arm being stretched is as high to the side as possible. Hold _5__ seconds. Repeat _5__ times per session. Do _2__ sessions per day.  Copyright  VHI. All rights reserved.  Side Stretch, Standing Reach   Stand, feet shoulder-width apart. Extend arms over head as high as possible. Hold __5_ seconds. Repeat _5__ times per session. Do _2__ sessions per day.  Copyright  VHI. All rights reserved.  Side Stretch, Standing Bend   Stand, one arm straight over head. Place other hand on hip and bend to that side as far as is comfortable. Hold __5_ seconds. Repeat _5__ times per session. Do _2__ sessions per day.  Copyright  VHI. All rights reserved.  Scapula Adduction With Pectorals, Low   Stand in doorframe with palms against frame and arms at 45. Lean forward and squeeze shoulder blades. Hold __5_ seconds. Repeat __5_ times per session. Do _2__ sessions per day.  Copyright  VHI. All rights reserved.  Scapula Adduction With Pectorals, High   Stand in doorframe with palms against frame and arms at 120. Lean forward and squeeze shoulder blades. Hold _5__ seconds. Repeat _5__ times per session. Do _2_ sessions per day.  Copyright  VHI. All rights reserved.

## 2014-02-14 ENCOUNTER — Ambulatory Visit: Payer: No Typology Code available for payment source | Attending: General Surgery | Admitting: Physical Therapy

## 2014-02-14 DIAGNOSIS — R5381 Other malaise: Secondary | ICD-10-CM | POA: Insufficient documentation

## 2014-02-14 DIAGNOSIS — M542 Cervicalgia: Secondary | ICD-10-CM

## 2014-02-14 DIAGNOSIS — M549 Dorsalgia, unspecified: Secondary | ICD-10-CM | POA: Insufficient documentation

## 2014-02-14 DIAGNOSIS — Z5189 Encounter for other specified aftercare: Secondary | ICD-10-CM | POA: Insufficient documentation

## 2014-02-14 NOTE — Therapy (Addendum)
[place], [place], [address] Phone: [phone]   Fax:  [phone]  Physical Therapy Treatment  Patient Details  Name: Deborah Trujillo MRN: [id number] Date of Birth: 1965-06-25  Encounter Date: 02/14/2014      PT End of Session - 02/14/14 1425    Visit Number 8   Number of Visits 8   PT Start Time 8329   PT Stop Time 1425   PT Time Calculation (min) 30 min      Past Medical History  Diagnosis Date  . Liver mass   . Hyperlipidemia   . Pain     PAIN - BURNING SENSATION RIGHT SIDE - BLOOD WORK ABNORMAL - FOUND TO HAVE LIVER MASS  . Colitis   . GERD (gastroesophageal reflux disease)     Past Surgical History  Procedure Laterality Date  . Neck mass excision  2009    absess removed   . Tubal ligation    . Laparoscopic partial hepatectomy Right 09/30/2013    Procedure: PARTIAL RIGHT HEPATECTOMY; CHOLECYSTECTOMY;  Surgeon: Stark Klein, MD;  Location: [facility];  Service: General;  Laterality: Right;  . Ercp N/A 10/06/2013    Procedure: ENDOSCOPIC RETROGRADE CHOLANGIOPANCREATOGRAPHY (ERCP);  Surgeon: Arta Silence, MD;  Location: [facility];  Service: Endoscopy;  Laterality: N/A;  . Ercp N/A 10/18/2013    Procedure: ENDOSCOPIC RETROGRADE CHOLANGIOPANCREATOGRAPHY (ERCP);  Surgeon: Jeryl Columbia, MD;  Location: [facility];  Service: Endoscopy;  Laterality: N/A;  . Ercp N/A 12/15/2013    Procedure: ENDOSCOPIC RETROGRADE CHOLANGIOPANCREATOGRAPHY (ERCP);  Surgeon: Arta Silence, MD;  Location: [facility];  Service: Endoscopy;  Laterality: N/A;  . Gastrointestinal stent removal N/A 12/15/2013    Procedure: GASTROINTESTINAL STENT REMOVAL;  Surgeon: Arta Silence, MD;  Location: [facility];  Service: Endoscopy;  Laterality: N/A;    There were no vitals taken for this visit.  Visit Diagnosis:  Physical deconditioning  Bilateral back pain, unspecified location  Pain in the neck      Subjective  Assessment - 02/14/14 1351    Symptoms Through interpreter:  Pt states she is doing more at home without much pain and is walking outside when the weather is good.  She agrees this should be her last visit   Currently in Pain? No/denies   only occasionally in her right shoulder                     [facility] Adult PT Treatment/Exercise - 02/14/14 1748    Lumbar Exercises: Stretches   Single Knee to Chest Stretch 5 reps   Lower Trunk Rotation 5 reps   Pelvic Tilt 5 reps   Lumbar Exercises: Standing   Functional Squats 10 reps   Lumbar Exercises: Supine   Clam 10 reps   Bent Knee Raise 10 reps   Other Supine Lumbar Exercises opposite arm and leg stretch                PT Education - 02/14/14 1747    Education provided Yes   Education Details core and Lower extremtity exercises   Person(s) Educated Patient   Methods Explanation;Handout   Comprehension Verbalized understanding;Returned demonstration                [facility] Goals - 02/14/14 1751    CC Long Term Goal  #1   Title Patient will report pain is decreased by 50 % so that she is better able to perfom her activities of daily  living   Status Achieved   CC Long Term Goal  #2   Title Patient will be able to perform a home exercise program   Status Achieved   CC Long Term Goal  #3   Title Pt will be independent in rolling and supine to sit   Status Achieved            Plan - 02/14/14 1426    Clinical Impression Statement Pt able to report and demonstrate her home exercise for her upper extremeties.  She was able to do core exercises and LE exercises with with good abdominal muscle activation using the illustrations on paper (we did not have the written instructions in Spanish). She feels that she is ready to discharge from PT and be able to conitnue the exercise on her own. Pt was discouraged from getting an abdominal binder because of the risk of futher abdominal muscle weakness.          Problem List Patient Active Problem List   Diagnosis Date Noted  . Biloma 10/28/2013  . Bile leak, postoperative 10/17/2013  . Hemangioma of liver s/p partial hepatectomy 09/30/2013 09/30/2013  . Pancreas cyst 07/26/2013  . RUQ abdominal pain 07/21/2012  . Other malaise and fatigue 07/21/2012    PHYSICAL THERAPY DISCHARGE SUMMARY  Visits from Start of Care: 8  Current functional level related to goals / functional outcomes: Independent at home with exercise and daily activities   Remaining deficits: Intermittent pain at 1/10   Education / Equipment: Home exercise Plan: Patient agrees to discharge.  Patient goals were partially met. Patient is being discharged due to meeting the stated rehab goals.  ?????         Donato Heinz. Owens Shark PT   Norwood Levo 02/14/2014, 5:52 PM  [place], [place], [address] Phone: [phone]   Fax:  [phone]

## 2014-02-14 NOTE — Patient Instructions (Addendum)
  PELVIC TILT  Lie on back, legs bent. Exhale, tilting top of pelvis back, pubic bone up, to flatten lower back. Inhale, rolling pelvis opposite way, top forward, pubic bone down, arch in back. Repeat __10__ times. Do __2__ sessions per day. Copyright  VHI. All rights reserved.     Lie with hips and knees bent. Slowly inhale, and then exhale. Pull navel toward spine and tighten pelvic floor. Hold for __10_ seconds. Continue to breathe in and out during hold. Rest for _10__ seconds. Repeat __10_ times. Do __2-3_ times a day.   Copyright  VHI. All rights reserved.  Knee Fold   Lie on back, legs bent, arms by sides. Exhale, lifting knee to chest. Inhale, returning. Keep abdominals flat, navel to spine. Repeat __10__ times, alternating legs. Do __2__ sessions per day.  Copyright  VHI. All rights reserved.  Knee Drop   Keep pelvis stable. Without rotating hips, slowly drop knee to side, pause, return to center, bring knee across midline toward opposite hip. Feel obliques engaging. Repeat for ___10_ times each leg.  Copyright  VHI. All rights reserved.  Isometric Hold With Pelvic Floor (Hook-Lying)    Copyright  VHI. All rights reserved.  Heel Slide to Straight   Slide one leg down to straight. Return. Be sure pelvis does not rock forward, tilt, rotate, or tip to side. Do _10__ times. Restabilize pelvis. Repeat with other leg. Do __1-2_ sets, __2_ times per day.  http://ss.exer.us/16   Copyright  VHI. All rights reserved.   Lower Trunk Rotation   Bring both knees in to chest. Rotate from side to side, keeping knees together and feet off floor. Repeat ____ times per set. Do ____ sets per session. Do ____ sessions per day.  http://orth.exer.us/151   Copyright  VHI. All rights reserved.    Squat with emphasis on bending knees and getting hips behind knees

## 2014-03-07 NOTE — Addendum Note (Signed)
Addended by: Arbutus Ped C on: 03/07/2014 12:11 PM   Modules accepted: Orders

## 2016-01-17 IMAGING — DX DG CHEST 1V PORT
1 series · 1 of 1 positions shown · non-contrast
Comparison: Single view of the chest 10/01/2013 and 09/27/2013.

CLINICAL DATA: Status post ERCP.  Cough.

EXAM:
PORTABLE CHEST - 1 VIEW

[chest ap]
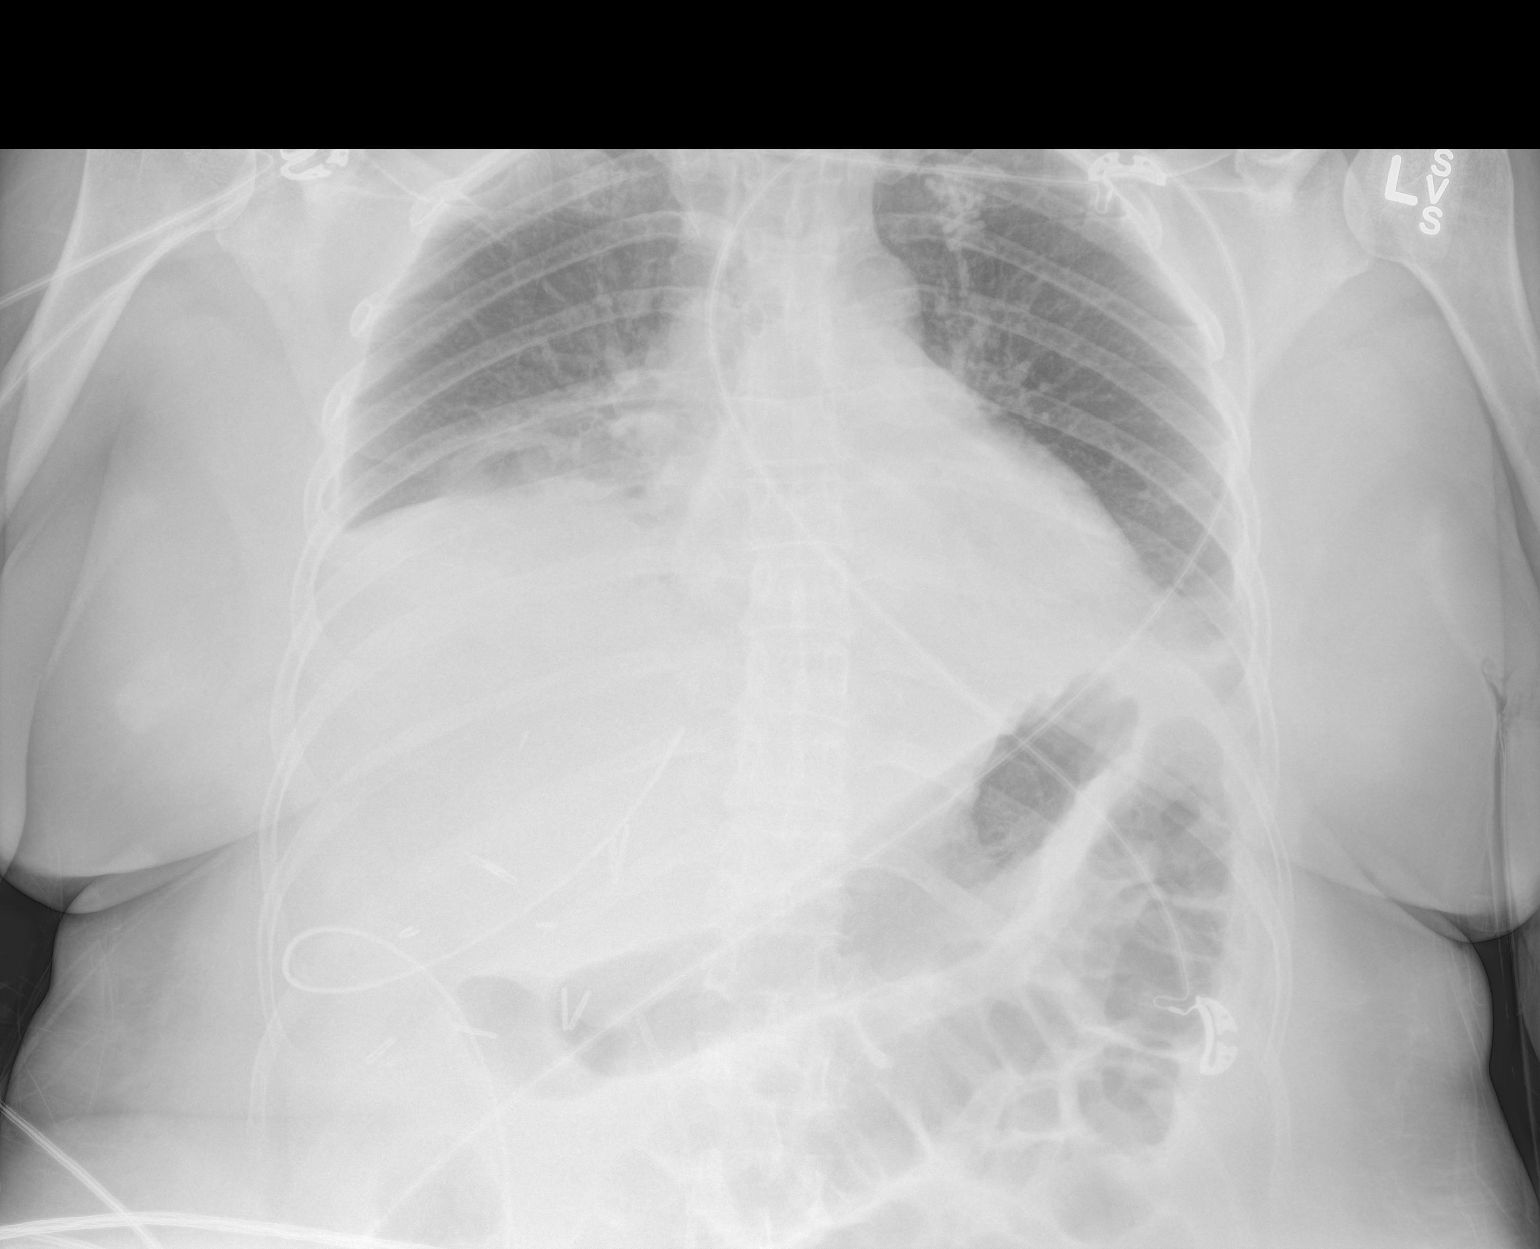

[1 of 1 positions shown; findings below may reference images not displayed]

FINDINGS: Right pleural effusion and basilar airspace disease persist. There
has been some increase in left basilar opacity which is much milder
than that seen on the right. No pneumothorax is identified. Heart
size is upper normal. Drainage catheter projecting in the right
upper quadrant of the abdomen is noted.
IMPRESSION: Right effusion and basilar airspace disease without marked change.
Milder degree of left basilar airspace opacity has worsened.
Airspace disease could be due to atelectasis or pneumonia.

## 2016-01-17 IMAGING — RF DG ERCP WO/W SPHINCTEROTOMY
5 series · 5 of 5 positions shown · non-contrast
Comparison: MRCP 08/03/2013

CLINICAL DATA: bilary leak

EXAM:
ERCP
TECHNIQUE: Multiple spot images obtained with the fluoroscopic device and
submitted for interpretation post-procedure.

[Series 1: cont. · 1 of 1 slices shown (1 of 5)]
[im 1/1]
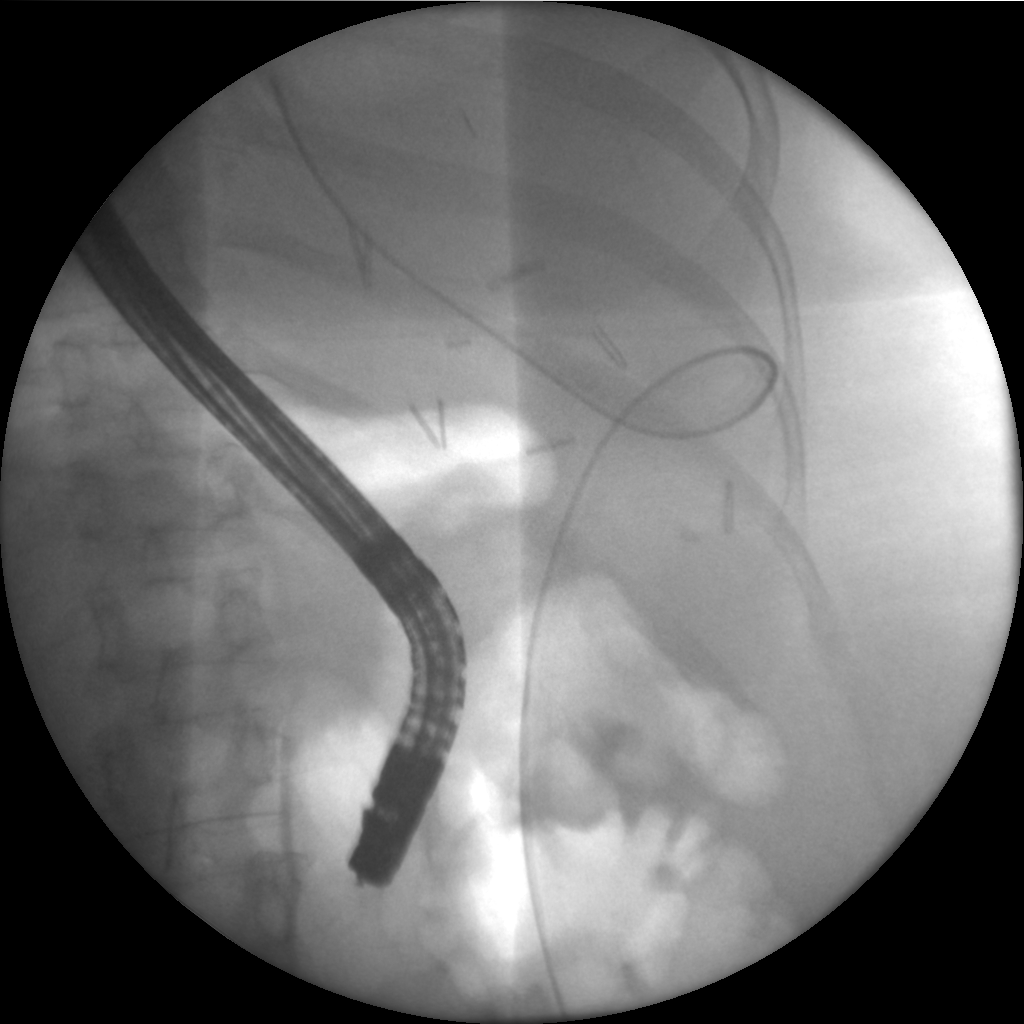

[Series 2: cont. · 1 of 1 slices shown (2 of 5)]
[im 1/1]
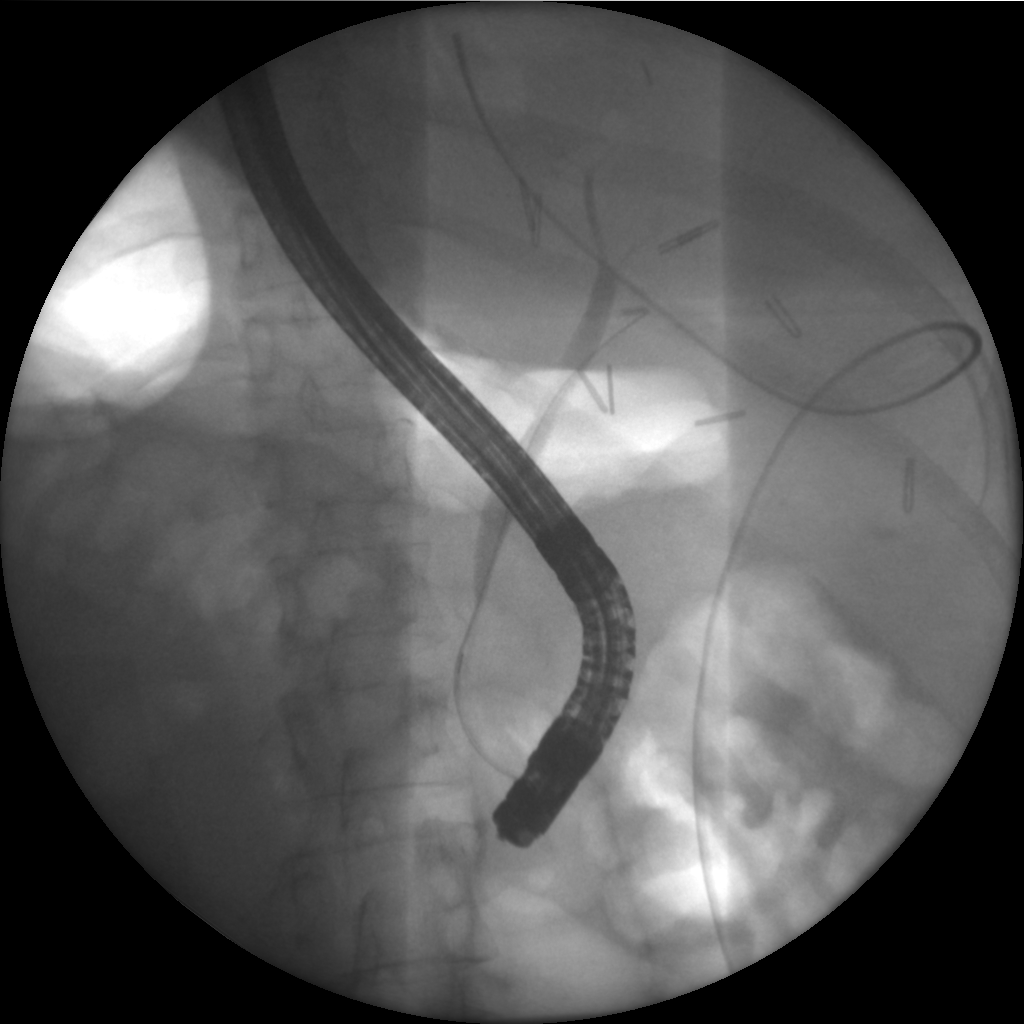

[Series 3: cont. · 1 of 1 slices shown (3 of 5)]
[im 1/1]
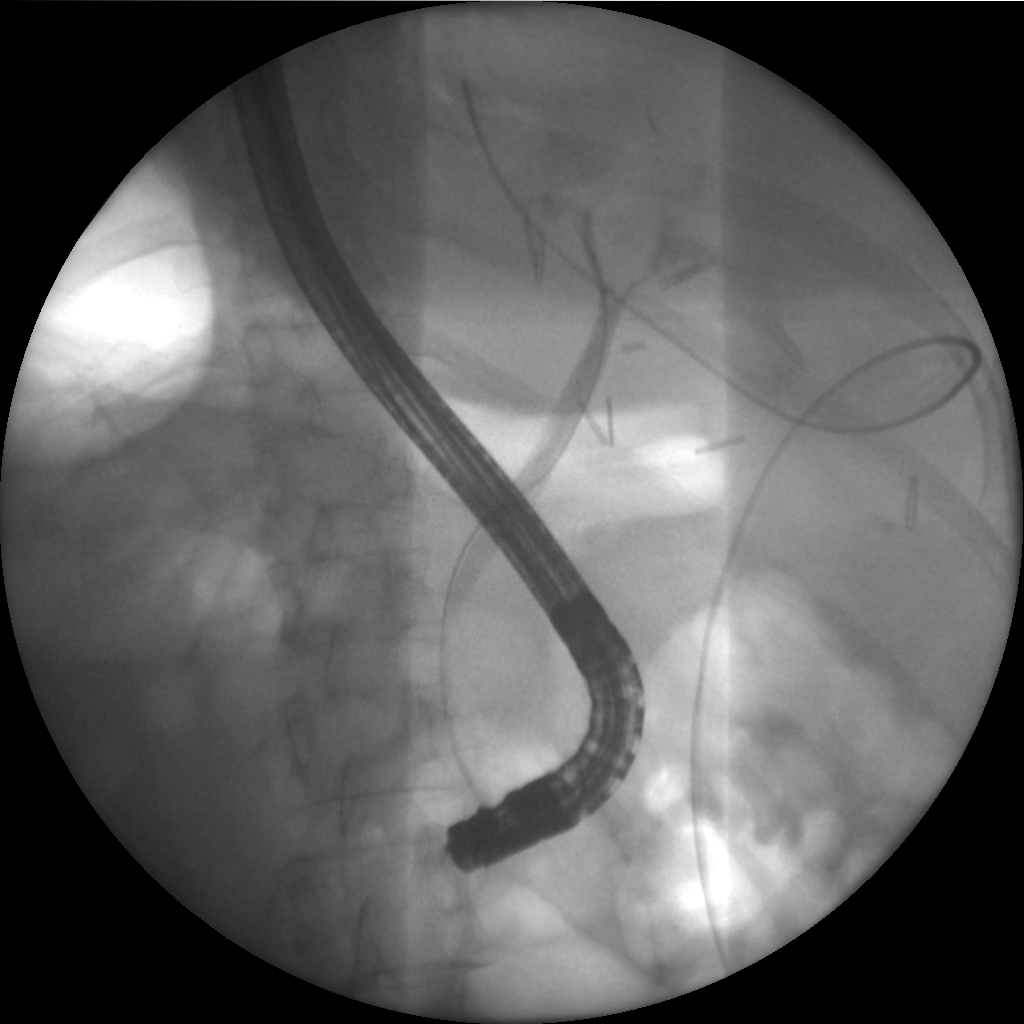

[Series 4: cont. · 1 of 1 slices shown (4 of 5)]
[im 1/1]
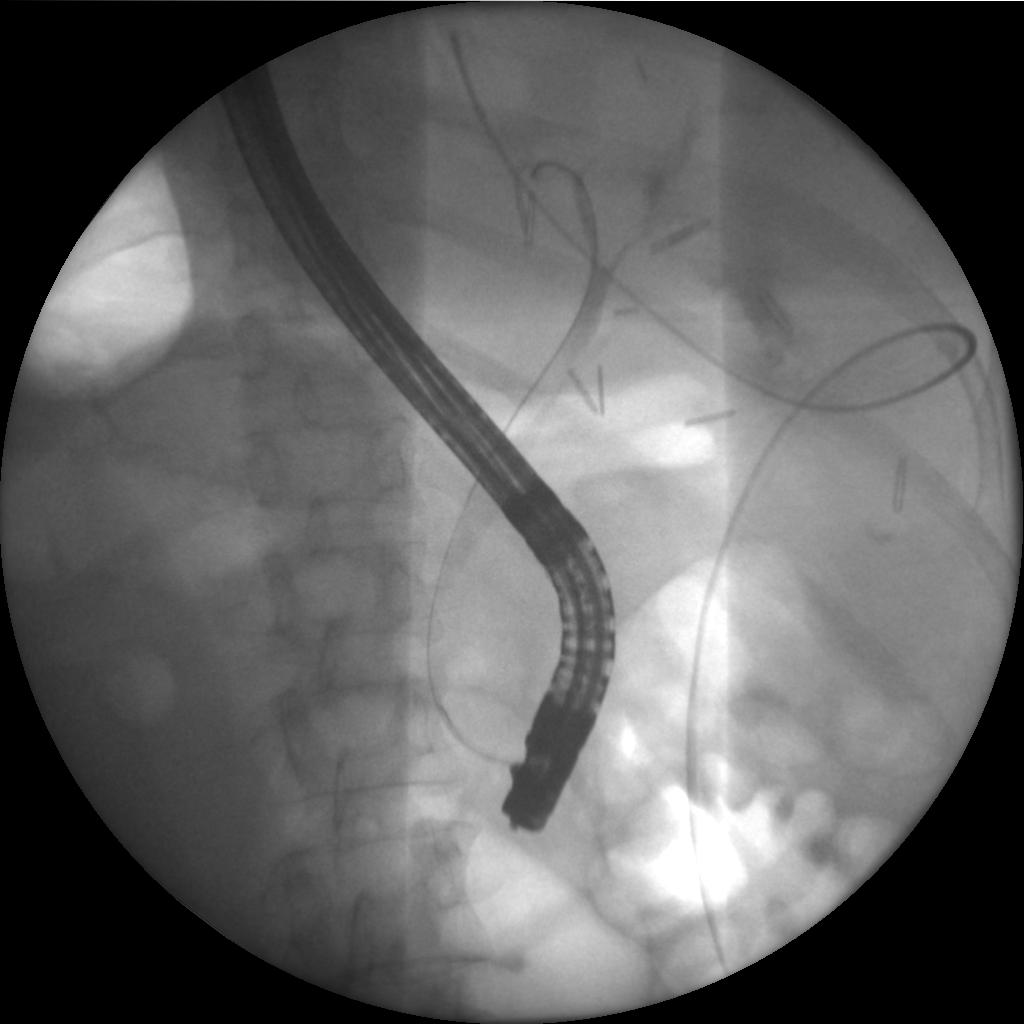

[Series 5: cont. · 1 of 1 slices shown (5 of 5)]
[im 1/1]
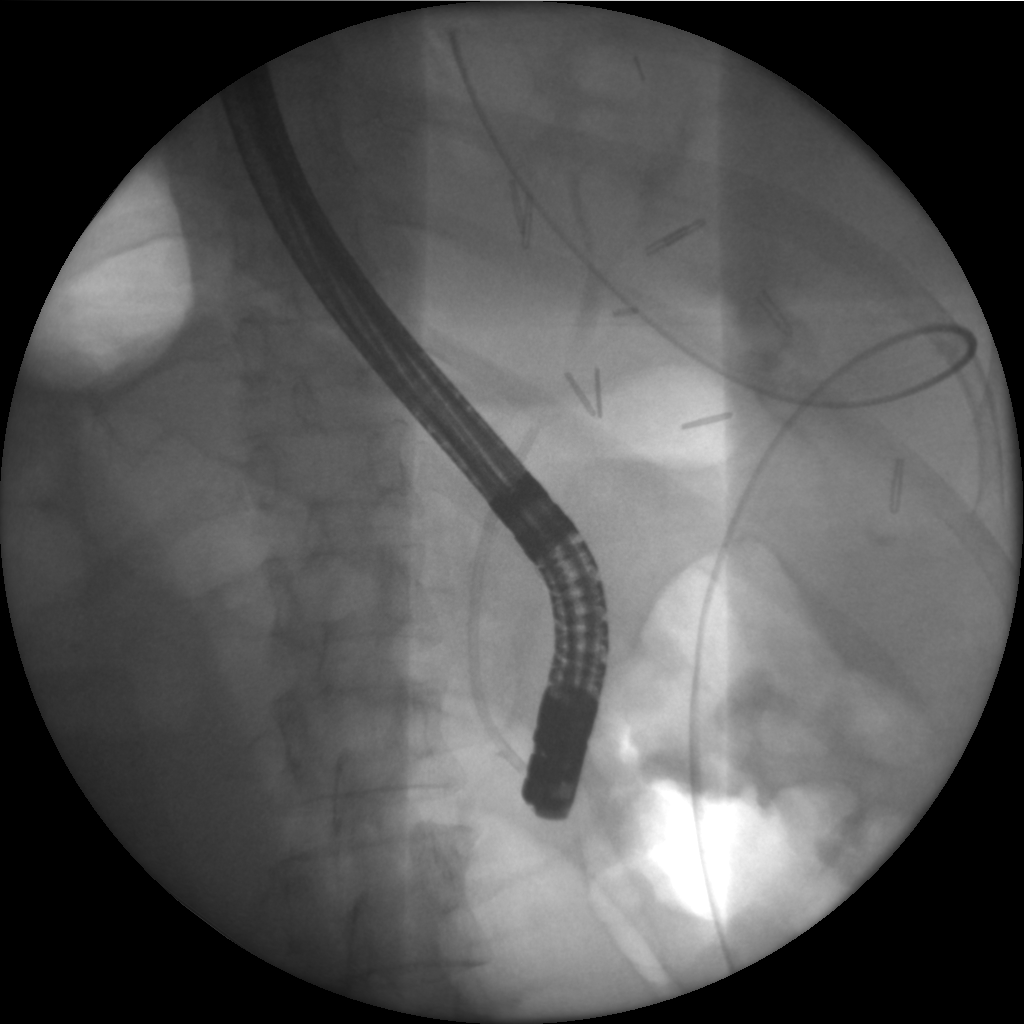

[5 of 5 positions shown; findings below may reference images not displayed]

FINDINGS: A series of 5 fluoroscopic spot images document endoscopic
cannulation and opacification of the CBD which is nondilated. There
is incomplete opacification of central intrahepatic bile ducts which
appear decompressed. Some probable extravasation is noted lateral to
the biliary confluence. Final image documents placement of a plastic
biliary stent in expected location.
IMPRESSION: 1. Endoscopic biliary stenting.

These images were submitted for radiologic interpretation only.
Please see the procedural report for the amount of contrast and the
fluoroscopy time utilized.

## 2016-01-22 IMAGING — CR DG CHEST 2V
2 series · 2 of 2 positions shown · non-contrast
Comparison: 10/06/2013

CLINICAL DATA: Followup. Cough evaluate pleural effusion and
pneumonia.

EXAM:
CHEST  2 VIEW

[w chest pa]
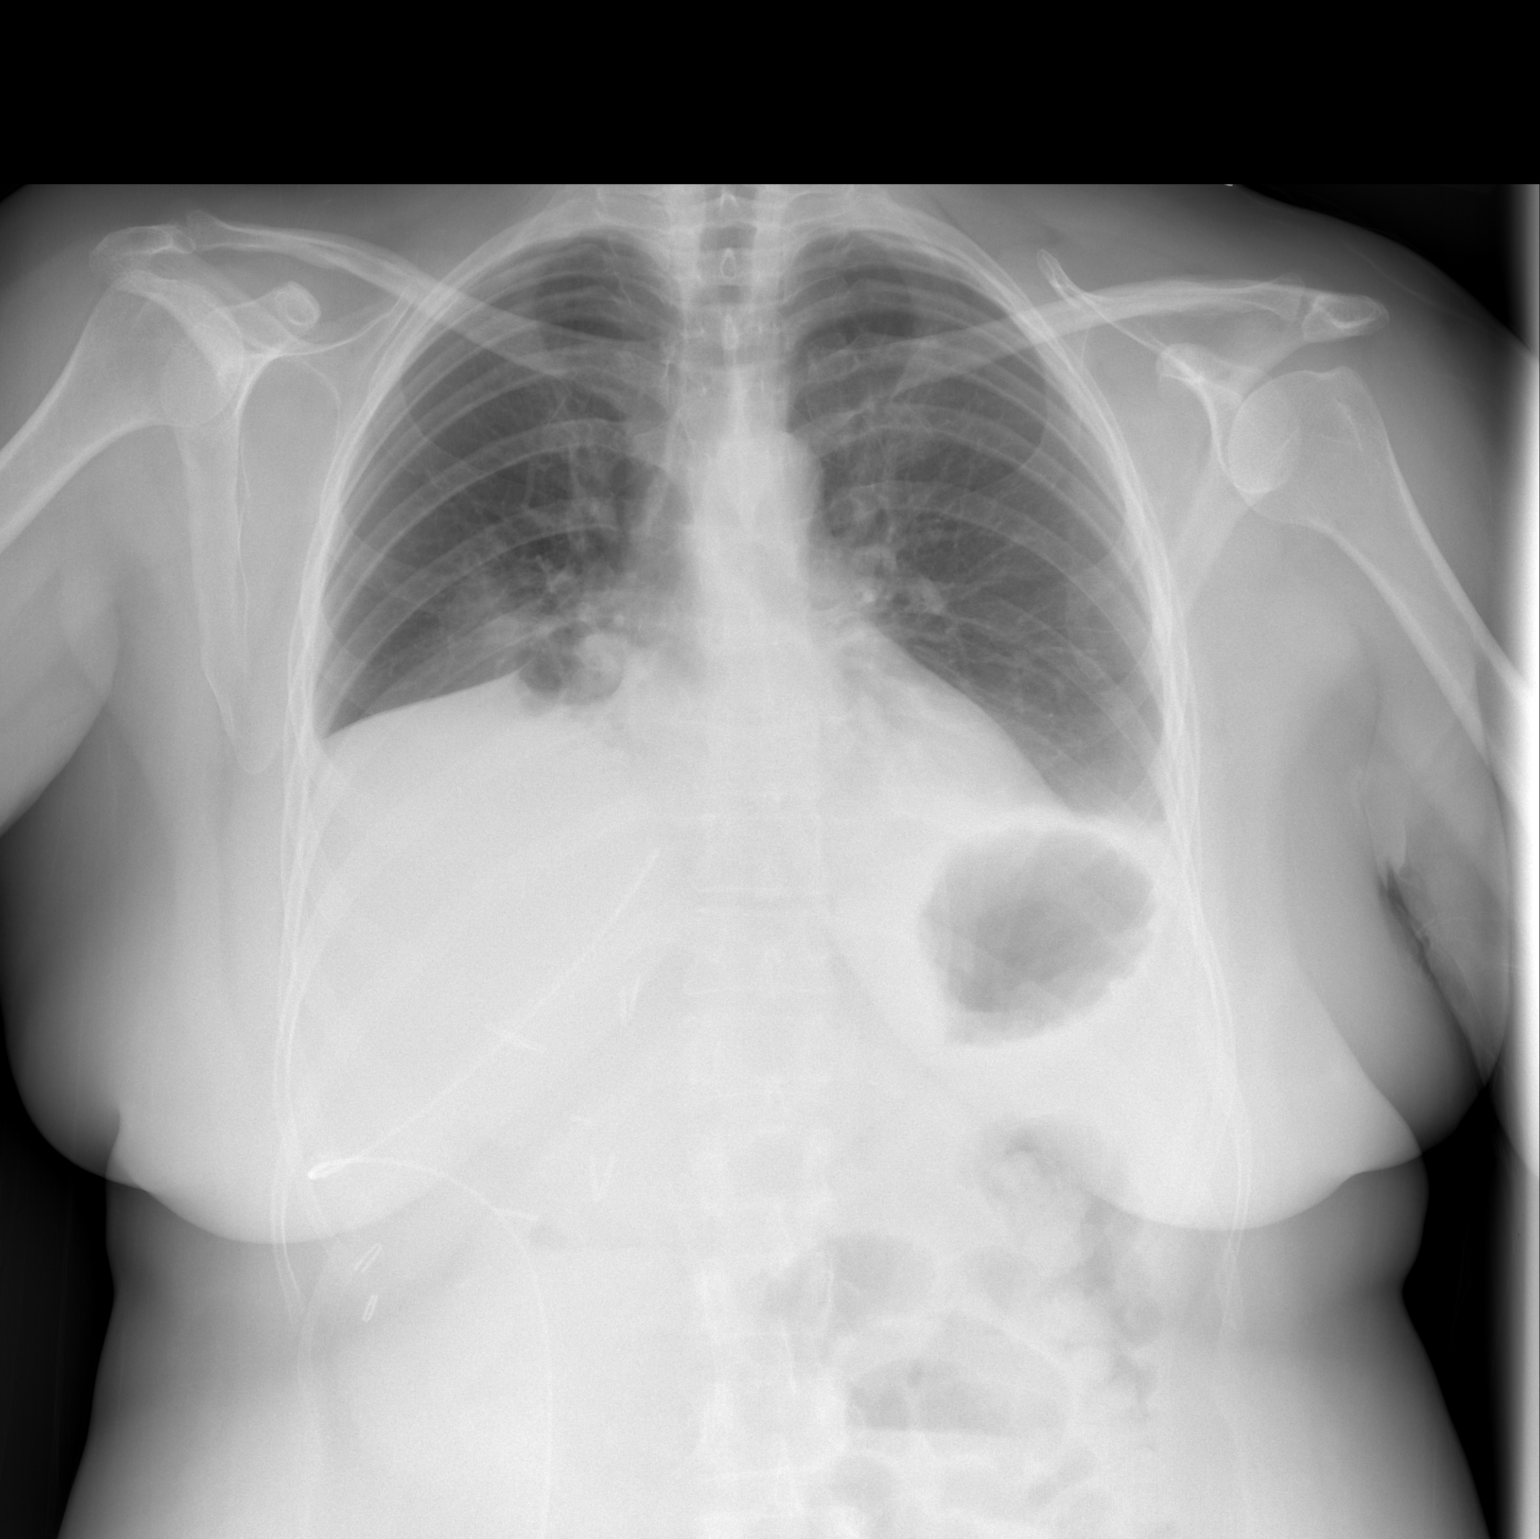

[w chest lat]
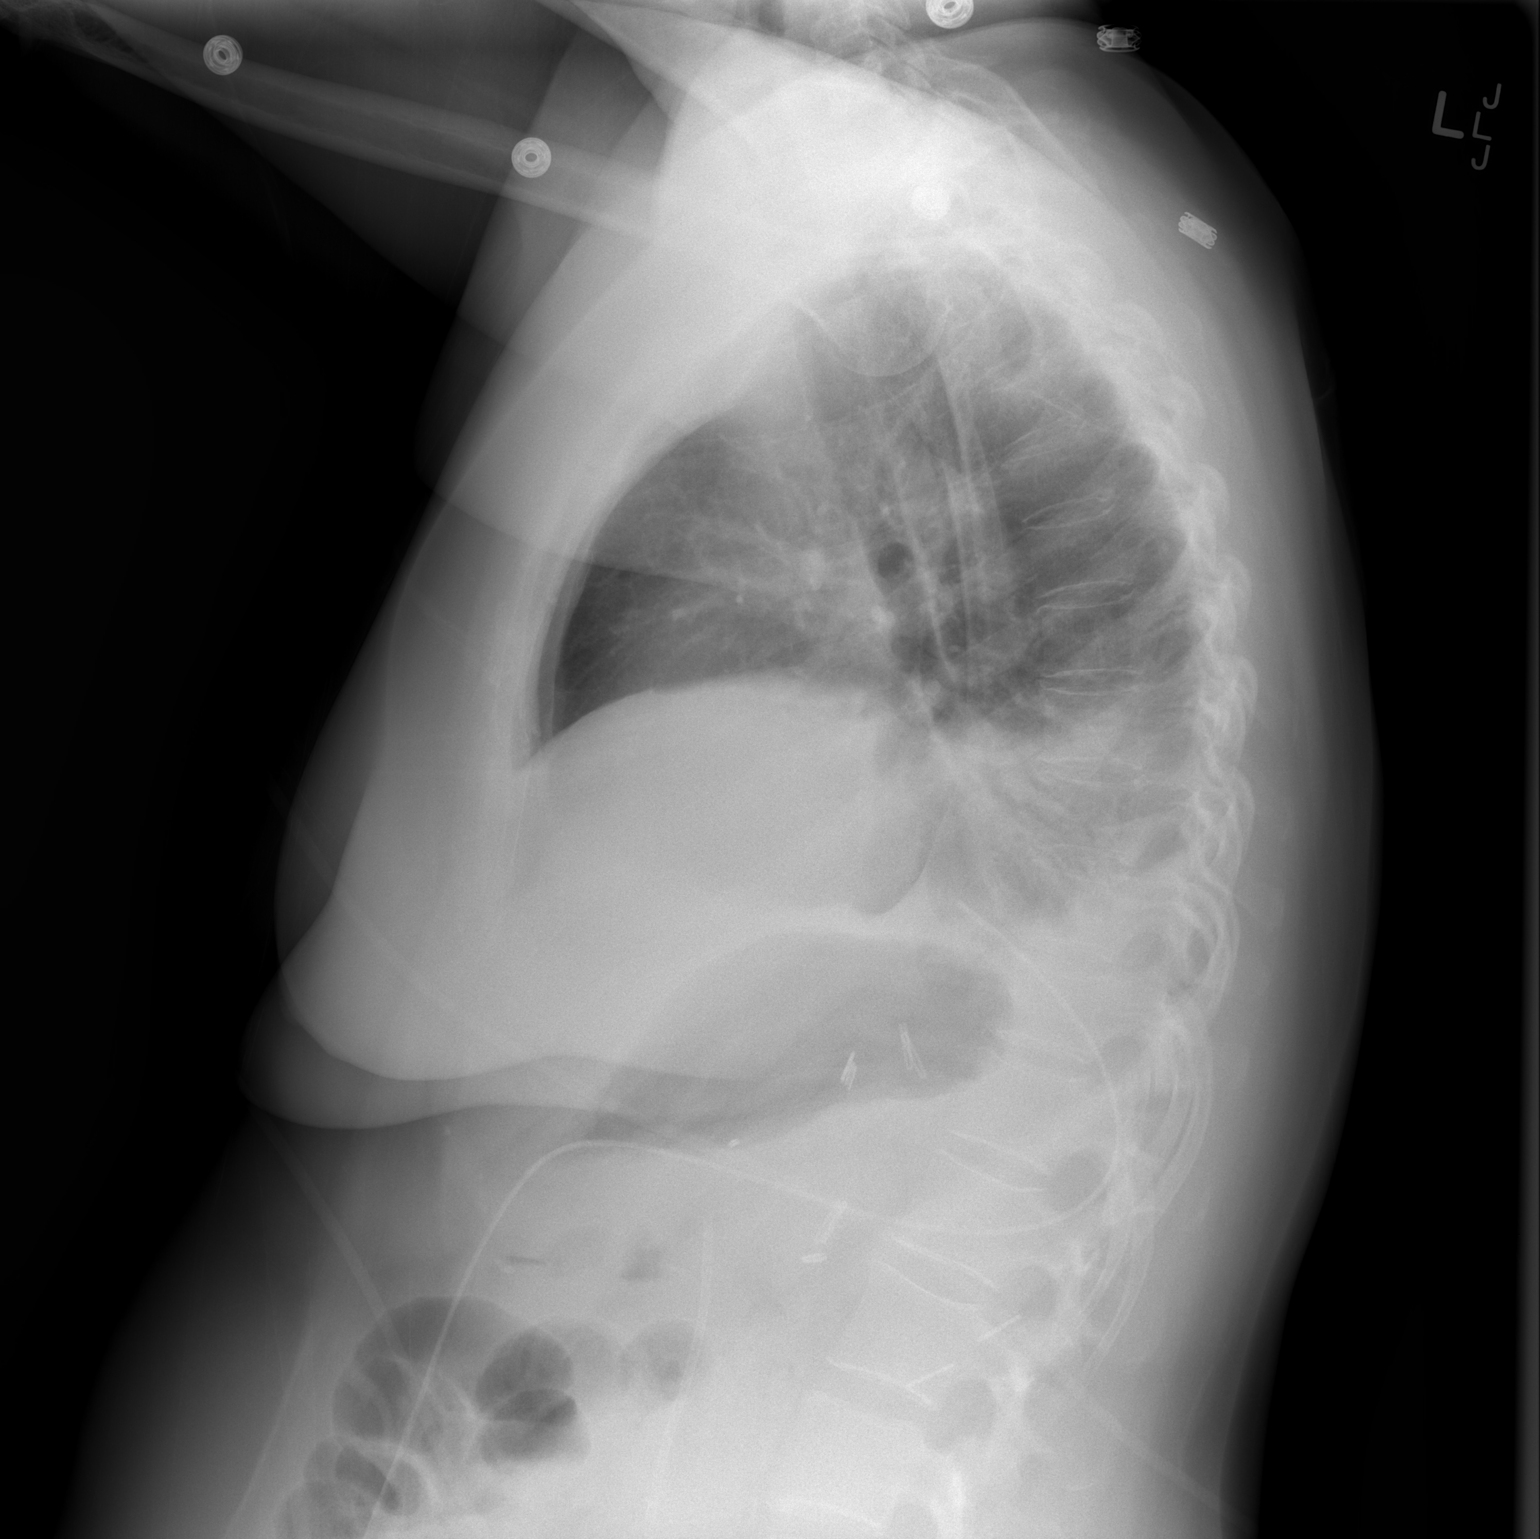

[2 of 2 positions shown; findings below may reference images not displayed]

FINDINGS: The heart size appears normal. There is a drainage catheter in the
projection of the right upper quadrant. Moderate right pleural
effusion is identified and appears unchanged in volume from previous
exam. Small left pleural effusion is also again noted.
IMPRESSION: No change in bilateral pleural effusions right greater than left.
